# Patient Record
Sex: Male | Born: 1975 | Race: White | Hispanic: No | Marital: Married | Smoking: Former smoker
Health system: Southern US, Community
[De-identification: ages and names within clinical notes are randomized; demographics above are authoritative.]

## PROBLEM LIST (undated history)

## (undated) DIAGNOSIS — K219 Gastro-esophageal reflux disease without esophagitis: Secondary | ICD-10-CM

## (undated) DIAGNOSIS — M549 Dorsalgia, unspecified: Secondary | ICD-10-CM

## (undated) DIAGNOSIS — F32A Depression, unspecified: Secondary | ICD-10-CM

## (undated) DIAGNOSIS — R519 Headache, unspecified: Secondary | ICD-10-CM

## (undated) DIAGNOSIS — M1990 Unspecified osteoarthritis, unspecified site: Secondary | ICD-10-CM

## (undated) DIAGNOSIS — F419 Anxiety disorder, unspecified: Secondary | ICD-10-CM

## (undated) DIAGNOSIS — I10 Essential (primary) hypertension: Secondary | ICD-10-CM

## (undated) DIAGNOSIS — Z8719 Personal history of other diseases of the digestive system: Secondary | ICD-10-CM

## (undated) DIAGNOSIS — G4730 Sleep apnea, unspecified: Secondary | ICD-10-CM

## (undated) DIAGNOSIS — S069X9A Unspecified intracranial injury with loss of consciousness of unspecified duration, initial encounter: Secondary | ICD-10-CM

## (undated) DIAGNOSIS — R079 Chest pain, unspecified: Secondary | ICD-10-CM

## (undated) DIAGNOSIS — R51 Headache: Secondary | ICD-10-CM

## (undated) DIAGNOSIS — J189 Pneumonia, unspecified organism: Secondary | ICD-10-CM

## (undated) DIAGNOSIS — F4310 Post-traumatic stress disorder, unspecified: Secondary | ICD-10-CM

## (undated) DIAGNOSIS — F329 Major depressive disorder, single episode, unspecified: Secondary | ICD-10-CM

## (undated) HISTORY — DX: Essential (primary) hypertension: I10

## (undated) HISTORY — PX: VASECTOMY: SHX75

## (undated) HISTORY — PX: COLONOSCOPY W/ BIOPSIES AND POLYPECTOMY: SHX1376

## (undated) HISTORY — PX: MOUTH SURGERY: SHX715

## (undated) HISTORY — DX: Major depressive disorder, single episode, unspecified: F32.9

## (undated) HISTORY — DX: Dorsalgia, unspecified: M54.9

## (undated) HISTORY — DX: Gastro-esophageal reflux disease without esophagitis: K21.9

## (undated) HISTORY — DX: Unspecified osteoarthritis, unspecified site: M19.90

## (undated) HISTORY — DX: Sleep apnea, unspecified: G47.30

## (undated) HISTORY — DX: Anxiety disorder, unspecified: F41.9

## (undated) HISTORY — PX: TONSILLECTOMY: SUR1361

## (undated) HISTORY — PX: ESOPHAGOGASTRODUODENOSCOPY ENDOSCOPY: SHX5814

## (undated) HISTORY — DX: Depression, unspecified: F32.A

## (undated) HISTORY — DX: Chest pain, unspecified: R07.9

## (undated) HISTORY — PX: KNEE SURGERY: SHX244

---

## 2016-10-20 ENCOUNTER — Other Ambulatory Visit (HOSPITAL_COMMUNITY): Payer: Self-pay | Admitting: General Surgery

## 2016-10-20 ENCOUNTER — Telehealth: Payer: Self-pay

## 2016-10-20 NOTE — Telephone Encounter (Signed)
Received referral from  Referral from [facility] for Baratric surgery Placed notes in pink file.

## 2016-10-24 NOTE — Telephone Encounter (Signed)
Pt is going to see Dr [place]ains [place]

## 2016-11-06 ENCOUNTER — Other Ambulatory Visit: Payer: Self-pay

## 2016-11-06 ENCOUNTER — Ambulatory Visit (HOSPITAL_COMMUNITY)
Admission: RE | Admit: 2016-11-06 | Discharge: 2016-11-06 | Disposition: A | Discharge status: Home / Self Care | Payer: Federal, State, Local not specified - PPO | Source: Ambulatory Visit | Attending: General Surgery | Admitting: General Surgery

## 2016-11-06 DIAGNOSIS — K449 Diaphragmatic hernia without obstruction or gangrene: Secondary | ICD-10-CM | POA: Diagnosis not present

## 2016-11-06 DIAGNOSIS — Z01810 Encounter for preprocedural cardiovascular examination: Secondary | ICD-10-CM | POA: Diagnosis present

## 2016-11-08 ENCOUNTER — Encounter: Payer: Self-pay | Admitting: Cardiology

## 2016-11-20 ENCOUNTER — Encounter: Payer: Self-pay | Admitting: *Deleted

## 2016-11-20 ENCOUNTER — Ambulatory Visit: Payer: Self-pay | Admitting: Cardiology

## 2016-11-20 ENCOUNTER — Encounter: Payer: Self-pay | Admitting: Cardiology

## 2016-11-20 ENCOUNTER — Ambulatory Visit (INDEPENDENT_AMBULATORY_CARE_PROVIDER_SITE_OTHER): Payer: Federal, State, Local not specified - PPO | Admitting: Cardiology

## 2016-11-20 VITALS — BP 136/74 | HR 81 | Ht 69.0 in | Wt 293.2 lb

## 2016-11-20 DIAGNOSIS — Z01810 Encounter for preprocedural cardiovascular examination: Secondary | ICD-10-CM

## 2016-11-20 DIAGNOSIS — Z87891 Personal history of nicotine dependence: Secondary | ICD-10-CM

## 2016-11-20 DIAGNOSIS — G4733 Obstructive sleep apnea (adult) (pediatric): Secondary | ICD-10-CM | POA: Diagnosis not present

## 2016-11-20 DIAGNOSIS — F4310 Post-traumatic stress disorder, unspecified: Secondary | ICD-10-CM

## 2016-11-20 NOTE — Progress Notes (Addendum)
Cardiology Office Note    Date:  11/20/2016   ID:  Ronald Summers, DOB [birth year], MRN [id number]  PCP:  No PCP Per Patient  Cardiologist:   Donato Schultz, MD     History of Present Illness:  Ronald Summers is a 41 y.o. male here for preoperative risk stratification at the request of Dr. Gaynelle Summers prior to bariatric surgery.  He was in the [employer] for 14 years, developed PTSD, was placed on citalopram by [facility] and gained 100 pounds. He has been seeing Dr. Andrey Summers and discussed the possibility of weight loss sleave.  He has a family history of coronary artery disease, father MI at age 37, prior bypass, his mother had CHF.   No anginal symptoms, no chest pain he does have mild dyspnea with heavy exertion but this is not new for him. He quit smoking a proximal 77 months ago. He quit dipping as well. He does Vape.  He is easily able to go up 2 flights of stairs without difficulty, able to complete greater than 4 METS of activity.  Now has OSA. Knee pain.      Past Medical History:  Diagnosis Date  . Anxiety disorder   . Arthritis   . Back pain   . Chest pain   . Depression   . Gastroesophageal reflux disease   . High blood pressure   . Sleep apnea     Past Surgical History:  Procedure Laterality Date  . COLONOSCOPY W/ BIOPSIES AND POLYPECTOMY    . KNEE SURGERY Right   . MOUTH SURGERY    . TONSILLECTOMY    . VASECTOMY      Current Medications: Outpatient Medications Prior to Visit  Medication Sig Dispense Refill  . clonazePAM (KLONOPIN) 1 MG tablet Take 1 mg by mouth 2 (two) times daily.    Marland Kitchen FLUoxetine (PROZAC) 10 MG capsule Take 10 mg by mouth daily.     No facility-administered medications prior to visit.      Allergies:   Patient has no known allergies.   Social History   Social History  . Marital status: Single    Spouse name: N/A  . Number of children: N/A  . Years of education: N/A   Social History Main Topics  . Smoking status:  Former Games developer  . Smokeless tobacco: Never Used  . Alcohol use Yes  . Drug use: No  . Sexual activity: Not Asked   Other Topics Concern  . None   Social History Narrative  . None     Family History:  The patient's family history includes Arthritis in his father and mother; Diabetes Mellitus I in his brother and mother; Heart disease in his father and mother; Hypertension in his brother, father, and mother.   ROS:   Please see the history of present illness.    ROS All other systems reviewed and are negative.   PHYSICAL EXAM:   VS:  BP 136/74   Pulse 81   Ht  (1.753 m)   Wt 293 lb 4 oz (133 kg)   SpO2 98%   BMI 43.31 kg/m    GEN: Well nourished, well developed, in no acute distress  HEENT: normal  Neck: no JVD, carotid bruits, or masses Cardiac: RRR; no murmurs, rubs, or gallops,no edema  Respiratory:  clear to auscultation bilaterally, normal work of breathing GI: soft, nontender, nondistended, + BS, obese MS: no deformity or atrophy  Skin: warm and dry, no rash Neuro:  Alert and Oriented x 3, Strength and sensation are intact Psych: euthymic mood, full affect  Wt Readings from Last 3 Encounters:  11/20/16 293 lb 4 oz (133 kg)      Studies/Labs Reviewed:   EKG:  EKG from 3/26 [place] 18 shows sinus rhythm with no other abnormalities. Personally viewed.  Recent Labs: No results found for requested labs within last 8760 hours.   Lipid Panel No results found for: CHOL, TRIG, HDL, CHOLHDL, VLDL, LDLCALC, LDLDIRECT  Additional studies/ records that were reviewed today include:   Prior medical records reviewed, EKG reviewed, barium swallow with small hiatal hernia reviewed.    ASSESSMENT:    1. Pre-operative cardiovascular examination   2. Morbid obesity (HCC)   3. History of tobacco use   4. PTSD (post-traumatic stress disorder)   5. Obstructive sleep apnea      PLAN:  In order of problems listed above:  Preoperative risk assessment  - Based upon  his ability to achieve greater than 4 METS of activity, he may proceed with area check surgery with a low cardiac risk. EKG is normal. He has no valvular abnormalities, no arrhythmias, no prior MI or heart failure. He does have a family history of CAD with his father. I'm very happy that he has stopped smoking. Weight loss will be very helpful for him to modify his overall cardiovascular risk. He has had his cholesterol checked does not remember the value. Continue with diet, exercise, weight loss, tobacco cessation.  History of tobacco use  - Excellent job of tobacco cessation. Encouraged him to discontinue Vaping.   PTSD/depression  - Per [facility].  Morbid obesity  - Continue to encourage weight loss. Gastric sleeve will be very helpful.  Obstructive sleep apnea  - Hopefully weight loss will be helpful and redeeming this issue.  Medication Adjustments/Labs and Tests Ordered: Current medicines are reviewed at length with the patient today.  Concerns regarding medicines are outlined above.  Medication changes, Labs and Tests ordered today are listed in the Patient Instructions below. Patient Instructions  Medication Instructions:  The current medical regimen is effective;  continue present plan and medications.  Follow-Up: Follow up as needed with Dr Anne Fu.  Thank you for choosing [facility]!!        Signed, Donato Schultz, MD  11/20/2016 4:59 PM    [facility] [address] [place], [place], [place]  [address] Phone: [phone]; Fax: [phone]

## 2016-11-20 NOTE — Patient Instructions (Signed)
Medication Instructions:  The current medical regimen is effective;  continue present plan and medications.  Follow-Up: Follow up as needed with Dr Skains.  Thank you for choosing [facility]!!     

## 2016-12-04 ENCOUNTER — Encounter: Payer: Federal, State, Local not specified - PPO | Attending: General Surgery | Admitting: Skilled Nursing Facility1

## 2016-12-04 ENCOUNTER — Encounter: Payer: Self-pay | Admitting: Skilled Nursing Facility1

## 2016-12-04 DIAGNOSIS — Z713 Dietary counseling and surveillance: Secondary | ICD-10-CM | POA: Insufficient documentation

## 2016-12-04 DIAGNOSIS — Z6841 Body Mass Index (BMI) 40.0 and over, adult: Secondary | ICD-10-CM | POA: Diagnosis not present

## 2016-12-04 DIAGNOSIS — E669 Obesity, unspecified: Secondary | ICD-10-CM

## 2016-12-04 NOTE — Patient Instructions (Signed)
Follow Pre-Op Goals Try Protein Shakes Call [facility] at [phone] when surgery is scheduled to enroll in Pre-Op Class  Things to remember:  Please always be honest with us. We want to support you!  If you have any questions or concerns in between appointments, please call or email   The diet after surgery will be high protein and low in carbohydrate.  Vitamins and calcium need to be taken for the rest of your life.  Feel free to include support people in any classes or appointments.   Supplement recommendations:  Before Surgery   1 Complete Multivitamin with Iron  3000 IU Vitamin D3  After Surgery   2 Chewable Multivitamins  **Best Choice - Opurity: Bypass and Sleeve Optimized Chewable    Bariatric Advantage Advanced Multi EA      3 Chewable Calcium (500 mg each, total 1200-1500 mg per day)  **Best Choice - Celebrate, Bariatric Advantage, or Wellesse  Other Options:  2 Celebrate MultiComplete with 18 mg Iron (this provides 6000 IU of  Vitamin D3)  4 Celebrate Essential Multi 2 in 1 (has calcium) + 18-60 mg separate  iron  Vitamins and Calcium are available at:   [facility]   [address], [place], [place] [address]   [email or web address]  [email or web address]  [email or web address]    

## 2016-12-04 NOTE — Progress Notes (Signed)
Pre-Op Assessment Visit:  Pre-Operative Sleeve Gastrectomy Surgery  Medical Nutrition Therapy:  Appt start time: 3:10  End time:    Patient was seen on 12/04/2016 for Pre-Operative Nutrition Assessment. Assessment and letter of approval faxed to [facility] Bariatric Surgery Program coordinator on 12/04/2016.  Pt states he has been eating with his non-dominate hand and chewing well and using smaller plates. A BMI conversation should be had to ensure insurance coverage.   Pt expectation of surgery: "to lose 100 pounds" "no more knee problems and stop wearing my sleep pap"   Pt expectation of Dietitian: "To help with healthy choices"  Start weight at [facility]: 284.9 lbs BMI: 41.47  24 hr Dietary Recall: First Meal: none Snack:  Second Meal: chicken and vegetable---yogurt Snack:  Third Meal: spagetti---steak---hamburgers Snack: cookies---ice cream Beverages: water, coffee, soda  Encouraged to engage in 150 minutes of moderate physical activity including cardiovascular and weight baring weekly  Handouts given during visit include:  . Pre-Op Goals . Bariatric Surgery Protein Shakes During the appointment today the following Pre-Op Goals were reviewed with the patient: . Maintain or lose weight as instructed by your surgeon . Make healthy food choices . Begin to limit portion sizes . Limited concentrated sugars and fried foods . Keep fat/sugar in the single digits per serving on          food labels . Practice CHEWING your food  (aim for 30 chews per bite or until applesauce consistency) . Practice not drinking 15 minutes before, during, and 30 minutes after each meal/snack . Avoid all carbonated beverages  . Avoid/limit caffeinated beverages  . Avoid all sugar-sweetened beverages . Consume 3 meals per day; eat every 3-5 hours . Make a list of non-food related activities . Aim for 64-100 ounces of FLUID daily  . Aim for at least 60-80 grams of PROTEIN daily . Look for  a liquid protein source that contain ?15 g protein and ?5 g carbohydrate  (ex: shakes, drinks, shots)  -Follow diet recommendations listed below   Energy and Macronutrient Recomendations: Calories: 1800 Carbohydrate: 200 Protein: 135 Fat: 50  Demonstrated degree of understanding via:  Teach Back  Teaching Method Utilized:  Visual Auditory Hands on  Barriers to learning/adherence to lifestyle change: none identified   Patient to call the [facility] to enroll in Pre-Op and Post-Op Nutrition Education when surgery date is scheduled.

## 2017-01-01 ENCOUNTER — Encounter: Payer: Federal, State, Local not specified - PPO | Attending: General Surgery | Admitting: Registered"

## 2017-01-01 DIAGNOSIS — Z6841 Body Mass Index (BMI) 40.0 and over, adult: Secondary | ICD-10-CM | POA: Diagnosis not present

## 2017-01-01 DIAGNOSIS — E669 Obesity, unspecified: Secondary | ICD-10-CM

## 2017-01-01 DIAGNOSIS — Z713 Dietary counseling and surveillance: Secondary | ICD-10-CM | POA: Diagnosis present

## 2017-01-01 NOTE — Patient Instructions (Addendum)
-   Aim to not drink 15 min prior to meal, during, and waiting 30 minutes after meal.   - Add in some physical activity to daily routine.

## 2017-01-01 NOTE — Progress Notes (Signed)
Appt start time: 2:29 end time: 2:53  Assessment: 1st SWL Appointment.   Start Wt at [facility]: 284.9 Wt: 278.0 BMI: 40.46   Pt arrives having lost 6.9 lbs from previous visit. Pt states he eats differently now. Pt states he has cut out soda, coffee, and tea. Pt states he was recently promoted at job. 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 Pt is excited and ready to have surgery.   Pt states he has 2 more SWL visits with us prior to meals.    MEDICATIONS: See list   DIETARY INTAKE:  24-hr recall:  B ( AM): protein powder with almond or soy milk  Snk ( AM): cheese stick  L ( PM): chicken, vegetable Snk ( PM): cheese stick w/ apple or greek yogurt D ( PM): steak Snk ( PM): none Beverages: water with flavor  Usual physical activity: none  Diet to Follow: Calories: 1800 Carbohydrate: 200 Protein: 135 Fat: 50  Preferred Learning Style:   Auditory  Visual  Hands on  Learning Readiness:   Ready  Change in progress    Nutritional Diagnosis:  [place]-3.3 Overweight/obesity related to past poor dietary habits and physical inactivity as evidenced by patient w/ planned sleeve gastrectomy surgery following dietary guidelines for continued weight loss.    Intervention:  Nutrition counseling for upcoming Bariatric Surgery.  Goals:  - Aim to not drink 15 min prior to meal, during, and waiting 30 minutes after meal.  - Add in some physical activity to daily routine.   Teaching Method Utilized:  Visual Auditory Hands on  Handouts given during visit include:  Vitamin and Mineral Recommendations  Barriers to learning/adherence to lifestyle change: none  Demonstrated degree of understanding via:  Teach Back   Monitoring/Evaluation:  Dietary intake, exercise, and body weight in 1 month(s).

## 2017-01-29 ENCOUNTER — Encounter: Payer: Federal, State, Local not specified - PPO | Attending: General Surgery | Admitting: Registered"

## 2017-01-29 DIAGNOSIS — Z6841 Body Mass Index (BMI) 40.0 and over, adult: Secondary | ICD-10-CM | POA: Insufficient documentation

## 2017-01-29 DIAGNOSIS — Z713 Dietary counseling and surveillance: Secondary | ICD-10-CM | POA: Diagnosis present

## 2017-01-29 DIAGNOSIS — E669 Obesity, unspecified: Secondary | ICD-10-CM

## 2017-01-29 NOTE — Progress Notes (Signed)
Appt start time: 2:00 end time: 2:20  Assessment: 2nd SWL Appointment.   Start Wt at [facility]: 284.9 Wt: 278.0, 292.3 BMI: 40.46   Pt arrives having gained 14.3 lbs from previous visit. Pt states his dad had a stroke a few weeks ago, and stayed with him  While he was in the hospital for a week and a half. Pt states hospital food was his only option and there were limited healthy options. Pt states he used Isopure protein drink and it gives him diarrhea. Pt reports he will continue to use isopure powder and make the shake himself. Pt states he was recently promoted at his job and wants to have surgery in Oct. Pt states he was unable to workout and focus on not drinking with meals while dad was in hospital.   Pt states he has 1 more SWL visits with us prior to meals.    MEDICATIONS: See list   DIETARY INTAKE:  24-hr recall:  B ( AM): protein powder with almond/soy milk or protein drink Snk ( AM): cheese stick, beef jerky L ( PM): chicken, yogurt Snk ( PM): cheese stick w/ apple or greek yogurt D ( PM): steak, chicken, vegetables Snk ( PM): none Beverages: water with flavor, protein shake  Usual physical activity: none  Diet to Follow: Calories: 1800 Carbohydrate: 200 Protein: 135 Fat: 50  Preferred Learning Style:   Auditory  Visual  Hands on  Learning Readiness:   Ready  Change in progress    Nutritional Diagnosis:  [place]-3.3 Overweight/obesity related to past poor dietary habits and physical inactivity as evidenced by patient w/ planned sleeve gastrectomy surgery following dietary guidelines for continued weight loss.    Intervention:  Nutrition counseling for upcoming Bariatric Surgery.  Goals:  - Aim to not drink 15 min prior to meal, during, and waiting 30 minutes after meal.  - Add in some physical activity to daily routine.  - Aim to chew 30 times per bite or to applesauce consistency.   Teaching Method Utilized:  Visual Auditory Hands on  Handouts given  during visit include:  none  Barriers to learning/adherence to lifestyle change: none  Demonstrated degree of understanding via:  Teach Back   Monitoring/Evaluation:  Dietary intake, exercise, and body weight in 1 month(s).

## 2017-01-29 NOTE — Patient Instructions (Addendum)
-   Aim to not drink 15 min prior to meal, during, and waiting 30 minutes after meal.   - Add in some physical activity to daily routine.   - Aim to chew 30 times per bite or to applesauce consistency.

## 2017-02-26 ENCOUNTER — Encounter: Payer: Self-pay | Admitting: Skilled Nursing Facility1

## 2017-02-26 ENCOUNTER — Encounter: Payer: Federal, State, Local not specified - PPO | Attending: General Surgery | Admitting: Skilled Nursing Facility1

## 2017-02-26 DIAGNOSIS — Z713 Dietary counseling and surveillance: Secondary | ICD-10-CM | POA: Insufficient documentation

## 2017-02-26 DIAGNOSIS — Z6841 Body Mass Index (BMI) 40.0 and over, adult: Secondary | ICD-10-CM | POA: Insufficient documentation

## 2017-02-26 DIAGNOSIS — E669 Obesity, unspecified: Secondary | ICD-10-CM

## 2017-02-26 NOTE — Progress Notes (Signed)
Appt start time: 2:00 end time: 2:20  Assessment: 3rd SWL Appointment.   Start Wt at [facility]: 284.9 Wt: 276 BMI: 40.76  Pt arrives having lost about 16 pounds.   Pt states he has been Lifting or walking for stress. Pt states he is very ready for surgery and very excited.    MEDICATIONS: See list   DIETARY INTAKE:  24-hr recall:  B ( AM): protein powder with almond/soy milk or protein drink Snk ( AM): cheese stick, beef jerky L ( PM): chicken, yogurt Snk ( PM): cheese stick w/ apple or greek yogurt D ( PM): steak, chicken, vegetables Snk ( PM): none Beverages: water with flavor, protein shake  Usual physical activity: weight lifting, walking every other night  Diet to Follow: Calories: 1800 Carbohydrate: 200 Protein: 135 Fat: 50  Preferred Learning Style:   Auditory  Visual  Hands on  Learning Readiness:   Ready  Change in progress    Nutritional Diagnosis:  [place]-3.3 Overweight/obesity related to past poor dietary habits and physical inactivity as evidenced by patient w/ planned sleeve gastrectomy surgery following dietary guidelines for continued weight loss.    Intervention:  Nutrition counseling for upcoming Bariatric Surgery.  Goals:  - Aim to not drink 15 min prior to meal, during, and waiting 30 minutes after meal.  - Add in some physical activity to daily routine.  - Aim to chew 30 times per bite or to applesauce consistency.   Teaching Method Utilized:  Visual Auditory Hands on  Handouts given during visit include:  none  Barriers to learning/adherence to lifestyle change: none  Demonstrated degree of understanding via:  Teach Back   Monitoring/Evaluation:  Dietary intake, exercise, and body weight in 1 month(s).

## 2017-03-19 ENCOUNTER — Encounter: Payer: Federal, State, Local not specified - PPO | Attending: General Surgery | Admitting: Skilled Nursing Facility1

## 2017-03-19 ENCOUNTER — Encounter: Payer: Self-pay | Admitting: Skilled Nursing Facility1

## 2017-03-19 DIAGNOSIS — Z713 Dietary counseling and surveillance: Secondary | ICD-10-CM | POA: Diagnosis not present

## 2017-03-19 DIAGNOSIS — E669 Obesity, unspecified: Secondary | ICD-10-CM

## 2017-03-19 DIAGNOSIS — Z6841 Body Mass Index (BMI) 40.0 and over, adult: Secondary | ICD-10-CM | POA: Diagnosis not present

## 2017-03-19 NOTE — Progress Notes (Signed)
Pre-Operative Nutrition Class:  Appt start time: 7408   End time:  1830.  Patient was seen on 03/19/2017 for Pre-Operative Bariatric Surgery [facility]   Surgery date:  Surgery type: Sleeve Start weight at [facility]: 284.9 Weight today: 277  Samples given per MNT protocol. Patient educated on appropriate usage: Celebrate Vitamins Multivitamin Lot # [id number] Exp: 01/2018  Celebrate Vitamins Calcium  Lot # [id number] Exp:dec-03-2017  [place]rmier Protein Shake Lot #[id number] Exp: 24/jan/2019 The following the learning objectives were met by the patient during this course:  Identify Pre-Op Dietary Goals and will begin 2 weeks pre-operatively  Identify appropriate sources of fluids and proteins   State protein recommendations and appropriate sources pre and post-operatively  Identify Post-Operative Dietary Goals and will follow for 2 weeks post-operatively  Identify appropriate multivitamin and calcium sources  Describe the need for physical activity post-operatively and will follow MD recommendations  State when to call healthcare provider regarding medication questions or post-operative complications  Handouts given during class include:  Pre-Op Bariatric Surgery Diet Handout  Protein Shake Handout  Post-Op Bariatric Surgery Nutrition Handout  [facility] Program Information Flyer  Support Group Information Flyer  [facility] Bariatric Supplements Price List  Follow-Up Plan: Patient will follow-up at [facility] 2 weeks post operatively for diet advancement per MD.

## 2017-04-04 NOTE — Progress Notes (Signed)
Clearance 11/20/16   DR. Skains on chart and epic

## 2017-04-04 NOTE — Progress Notes (Signed)
Please place orders in EPIC as patient is being scheduled for a pre-op appointment! Thank you! 

## 2017-04-04 NOTE — Patient Instructions (Addendum)
Ronald Summers  04/04/2017   Your procedure is scheduled on: 04/09/17  Report to [facility] Main  Entrance Take [place]  elevators to 3rd floor to  Short Stay Center at     1020AM.    Call this number if you have problems the morning of surgery [phone]    Remember: ONLY 1 PERSON MAY GO WITH YOU TO SHORT STAY TO GET  READY MORNING OF YOUR SURGERY.  Do not eat food or drink liquids :After Midnight.     Take these medicines the morning of surgery with A SIP OF WATER:  prozac , klonopin,                                You may not have any metal on your body including hair pins and              piercings  Do not wear jewelry,  lotions, powders or perfumes, deodorant                     Men may shave face and neck.   Do not bring valuables to the hospital. [facility] IS NOT             RESPONSIBLE   FOR VALUABLES.  Contacts, dentures or bridgework may not be worn into surgery.  Leave suitcase in the car. After surgery it may be brought to your room.                  Please read over the following fact sheets you were given: _____________________________________________________________________           [facility] - Preparing for Surgery Before surgery, you can play an important role.  Because skin is not sterile, your skin needs to be as free of germs as possible.  You can reduce the number of germs on your skin by washing with CHG (chlorahexidine gluconate) soap before surgery.  CHG is an antiseptic cleaner which kills germs and bonds with the skin to continue killing germs even after washing. Please DO NOT use if you have an allergy to CHG or antibacterial soaps.  If your skin becomes reddened/irritated stop using the CHG and inform your nurse when you arrive at Short Stay. Do not shave (including legs and underarms) for at least 48 hours prior to the first CHG shower.  You may shave your face/neck. Please follow these instructions carefully:  1.   Shower with CHG Soap the night before surgery and the  morning of Surgery.  2.  If you choose to wash your hair, wash your hair first as usual with your  normal  shampoo.  3.  After you shampoo, rinse your hair and body thoroughly to remove the  shampoo.                           4.  Use CHG as you would any other liquid soap.  You can apply chg directly  to the skin and wash                       Gently with a scrungie or clean washcloth.  5.  Apply the CHG Soap to your body ONLY FROM THE NECK DOWN.   Do not use  on face/ open                           Wound or open sores. Avoid contact with eyes, ears mouth and genitals (private parts).                       Wash face,  Genitals (private parts) with your normal soap.             6.  Wash thoroughly, paying special attention to the area where your surgery  will be performed.  7.  Thoroughly rinse your body with warm water from the neck down.  8.  DO NOT shower/wash with your normal soap after using and rinsing off  the CHG Soap.                9.  Pat yourself dry with a clean towel.            10.  Wear clean pajamas.            11.  Place clean sheets on your bed the night of your first shower and do not  sleep with pets. Day of Surgery : Do not apply any lotions/deodorants the morning of surgery.  Please wear clean clothes to the hospital/surgery center.  FAILURE TO FOLLOW THESE INSTRUCTIONS MAY RESULT IN THE CANCELLATION OF YOUR SURGERY PATIENT SIGNATURE_________________________________  NURSE SIGNATURE__________________________________  ________________________________________________________________________   Ronald Summers  An incentive spirometer is a tool that can help keep your lungs clear and active. This tool measures how well you are filling your lungs with each breath. Taking long deep breaths may help reverse or decrease the chance of developing breathing (pulmonary) problems (especially infection) following:  A long  period of time when you are unable to move or be active. BEFORE THE PROCEDURE   If the spirometer includes an indicator to show your best effort, your nurse or respiratory therapist will set it to a desired goal.  If possible, sit up straight or lean slightly forward. Try not to slouch.  Hold the incentive spirometer in an upright position. INSTRUCTIONS FOR USE  1. Sit on the edge of your bed if possible, or sit up as far as you can in bed or on a chair. 2. Hold the incentive spirometer in an upright position. 3. Breathe out normally. 4. Place the mouthpiece in your mouth and seal your lips tightly around it. 5. Breathe in slowly and as deeply as possible, raising the piston or the ball toward the top of the column. 6. Hold your breath for 3-5 seconds or for as long as possible. Allow the piston or ball to fall to the bottom of the column. 7. Remove the mouthpiece from your mouth and breathe out normally. 8. Rest for a few seconds and repeat Steps 1 through 7 at least 10 times every 1-2 hours when you are awake. Take your time and take a few normal breaths between deep breaths. 9. The spirometer may include an indicator to show your best effort. Use the indicator as a goal to work toward during each repetition. 10. After each set of 10 deep breaths, practice coughing to be sure your lungs are clear. If you have an incision (the cut made at the time of surgery), support your incision when coughing by placing a pillow or rolled up towels firmly against it. Once you are able to get out of bed, walk around  indoors and cough well. You may stop using the incentive spirometer when instructed by your caregiver.  RISKS AND COMPLICATIONS  Take your time so you do not get dizzy or light-headed.  If you are in pain, you may need to take or ask for pain medication before doing incentive spirometry. It is harder to take a deep breath if you are having pain. AFTER USE  Rest and breathe slowly and  easily.  It can be helpful to keep track of a log of your progress. Your caregiver can provide you with a simple table to help with this. If you are using the spirometer at home, follow these instructions: [place] IF:   You are having difficultly using the spirometer.  You have trouble using the spirometer as often as instructed.  Your pain medication is not giving enough relief while using the spirometer.  You develop fever of 100.5 F (38.1 C) or higher. SEEK IMMEDIATE MEDICAL CARE IF:   You cough up bloody sputum that had not been present before.  You develop fever of 102 F (38.9 C) or greater.  You develop worsening pain at or near the incision site. MAKE SURE YOU:   Understand these instructions.  Will watch your condition.  Will get help right away if you are not doing well or get worse. Document Released: 12/11/2006 Document Revised: 10/23/2011 Document Reviewed: 02/11/2007 [facility] Patient Information 2014 [place], [place].   ________________________________________________________________________

## 2017-04-04 NOTE — Progress Notes (Addendum)
ekg 11/06/16 epic cxr 11/06/16 epic

## 2017-04-05 ENCOUNTER — Ambulatory Visit: Payer: Self-pay | Admitting: General Surgery

## 2017-04-06 ENCOUNTER — Encounter (HOSPITAL_COMMUNITY): Payer: Self-pay

## 2017-04-06 ENCOUNTER — Encounter (HOSPITAL_COMMUNITY)
Admission: RE | Admit: 2017-04-06 | Discharge: 2017-04-06 | Disposition: A | Discharge status: Home / Self Care | Payer: Federal, State, Local not specified - PPO | Source: Ambulatory Visit | Attending: General Surgery | Admitting: General Surgery

## 2017-04-06 ENCOUNTER — Ambulatory Visit: Payer: Self-pay | Admitting: General Surgery

## 2017-04-06 HISTORY — DX: Personal history of other diseases of the digestive system: Z87.19

## 2017-04-06 HISTORY — DX: Unspecified intracranial injury with loss of consciousness of unspecified duration, initial encounter: S06.9X9A

## 2017-04-06 HISTORY — DX: Headache: R51

## 2017-04-06 HISTORY — DX: Headache, unspecified: R51.9

## 2017-04-06 HISTORY — DX: Pneumonia, unspecified organism: J18.9

## 2017-04-06 HISTORY — DX: Post-traumatic stress disorder, unspecified: F43.10

## 2017-04-06 HISTORY — DX: Anxiety disorder, unspecified: F41.9

## 2017-04-06 LAB — COMPREHENSIVE METABOLIC PANEL
ALBUMIN: 4.3 g/dL (ref 3.5–5.0)
ALT: 31 U/L (ref 17–63)
ANION GAP: 8 (ref 5–15)
AST: 27 U/L (ref 15–41)
Alkaline Phosphatase: 72 U/L (ref 38–126)
BUN: 9 mg/dL (ref 6–20)
CHLORIDE: 103 mmol/L (ref 101–111)
CO2: 27 mmol/L (ref 22–32)
Calcium: 9.2 mg/dL (ref 8.9–10.3)
Creatinine, Ser: 1.06 mg/dL (ref 0.61–1.24)
GFR calc Af Amer: >60 mL/min (ref >60)
GFR calc non Af Amer: >60 mL/min (ref >60)
GLUCOSE: 101 mg/dL — AB (ref 65–99)
POTASSIUM: 4.2 mmol/L (ref 3.5–5.1)
SODIUM: 138 mmol/L (ref 135–145)
TOTAL PROTEIN: 7.9 g/dL (ref 6.5–8.1)
Total Bilirubin: 1 mg/dL (ref 0.3–1.2)

## 2017-04-06 LAB — CBC WITH DIFFERENTIAL/PLATELET
BASOS ABS: 0 10*3/uL (ref 0.0–0.1)
BASOS PCT: 0 %
Eosinophils Absolute: 0.2 10*3/uL (ref 0.0–0.7)
Eosinophils Relative: 2 %
HEMATOCRIT: 46.2 % (ref 39.0–52.0)
HEMOGLOBIN: 16.4 g/dL (ref 13.0–17.0)
LYMPHS PCT: 26 %
Lymphs Abs: 2 10*3/uL (ref 0.7–4.0)
MCH: 30.3 pg (ref 26.0–34.0)
MCHC: 35.5 g/dL (ref 30.0–36.0)
MCV: 85.2 fL (ref 78.0–100.0)
MONO ABS: 0.5 10*3/uL (ref 0.1–1.0)
Monocytes Relative: 7 %
NEUTROS ABS: 5.2 10*3/uL (ref 1.7–7.7)
NEUTROS PCT: 65 %
PLATELETS: 235 10*3/uL (ref 150–400)
RBC: 5.42 MIL/uL (ref 4.22–5.81)
RDW: 12.3 % (ref 11.5–15.5)
WBC: 7.9 10*3/uL (ref 4.0–10.5)

## 2017-04-06 NOTE — H&P (Signed)
Ronald Summers 04/06/2017 8:15 AM Location: [facility] Patient #: [id number] DOB: 08/09/1976 Married / Language: English / Race: White Male  History of Present Illness ([name] Geovanie Winnett MD; 04/06/2017 8:38 AM) The patient is a 41 year old male who presents for a pre-op visit. He comes in today for his preoperative visit. I initially met him in March of this year. His weight at that time was 281 pounds. He denies any medical changes since he was initially seen. He still occasionally has reflux symptoms. He is still compliant with his CPAP. He did see Dr. Skains in cardiology and was found to be low risk for surgery. His bariatric evaluation lab work was unremarkable. His preoperative upper GI did show a small hiatal hernia. He denies any chest pain, chest pressure, source of breath, orthopnea, paroxysmal nocturnal dyspnea, melena or hematochezia. He denies any hematemesis.   Problem List/Past Medical ([name] 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 Conlan [name], MD; 04/06/2017 8:41 AM) Colon Polyp Removal - Colonoscopy Knee Surgery Right. Oral Surgery Tonsillectomy Vasectomy  Diagnostic Studies History ([name] M. [name], MD; 04/06/2017 8:41 AM) Colonoscopy 1-5 years ago  Allergies (Crystale Giannattasio M. Meya Clutter, MD; 04/06/2017 8:41 AM) No Known Drug Allergies 10/19/2016  Medication History ([name] [name], MD; 04/06/2017 8:41 AM) Medications Reconciled OxyCODONE HCl (5MG/5ML Solution, 5-10 Milliliter Oral every four hours, as needed, Taken starting  04/06/2017) Active. Zofran ODT (4MG Tablet Disint, 1 (one) Tablet Disperse Oral every six hours, as needed, Taken starting 04/06/2017) Active. Protonix (40MG Tablet DR, 1 (one) Tablet Oral daily, Taken starting 04/06/2017) Active. PROzac (10MG Capsule, Oral) Active. KlonoPIN (1MG Tablet, Oral) Active.  Social History ([name] [name], MD; 04/06/2017 8:41 AM) Alcohol use Occasional alcohol use. Caffeine use Carbonated beverages, Coffee, Tea. No drug use Tobacco use Former smoker.  Family History ([name] [name], MD; 04/06/2017 8:41 AM) Arthritis Father, Mother. Diabetes Mellitus Brother, Mother. Heart Disease Father, Mother. Heart disease in male family member before age 55 Hypertension Brother, Father, Mother.  Other Problems ([name] [name], MD; 04/06/2017 8:42 AM) Anxiety Disorder Arthritis Back Pain Chest pain Depression Gastroesophageal Reflux Disease High blood pressure Sleep Apnea     Review of Systems ([name] [name]; 04/06/2017 8:38 AM) General Present- Fatigue and Weight Gain. Not Present- Appetite Loss, Chills, Fever, Night Sweats and Weight Loss. Skin Present- Dryness and Non-Healing Wounds. Not Present- Change in Wart/Mole, Hives, Jaundice, New Lesions, Rash and Ulcer. HEENT Present- Hearing Loss, Ringing in the Ears and Wears glasses/contact lenses. Not Present- Earache, Hoarseness, Nose Bleed, Oral Ulcers, Seasonal Allergies, Sinus Pain, Sore Throat, Visual Disturbances and Yellow Eyes. Respiratory Present- Snoring and Wheezing. Not Present- Bloody sputum, Chronic Cough and Difficulty Breathing. Breast Not Present- Breast Mass, Breast Pain, Nipple Discharge and Skin Changes. Cardiovascular Present- Chest Pain, Difficulty Breathing Lying Down, Leg Cramps, Palpitations and Shortness of Breath. Not Present- Rapid Heart Rate and Swelling of Extremities. Gastrointestinal Present- Change in Bowel Habits and Difficulty Swallowing. Not Present-  Abdominal Pain, Bloating, Bloody Stool, Chronic diarrhea, Constipation, Excessive gas, Gets full quickly at meals, Hemorrhoids, Indigestion, Nausea, Rectal Pain and Vomiting. Male Genitourinary Present- Nocturia. Not   Present- Blood in Urine, Change in Urinary Stream, Frequency, Impotence, Painful Urination, Urgency and Urine Leakage. Musculoskeletal Present- Back Pain, Joint Pain, Joint Stiffness and Muscle Pain. Not Present- Muscle Weakness and Swelling of Extremities. Neurological Present- Decreased Memory and Headaches. Not Present- Fainting, Numbness, Seizures, Tingling, Tremor, Trouble walking and Weakness. Psychiatric Present- Anxiety and Change in Sleep Pattern. Not Present- Bipolar, Depression, Fearful and Frequent crying. Endocrine Present- Excessive Hunger. Not Present- Cold Intolerance, Hair Changes, Heat Intolerance and New Diabetes.  Vitals ([name]; 04/06/2017 8:16 AM) 04/06/2017 8:15 AM Weight: 266 lb Height: 70in Body Surface Area: 2.36 m Body Mass Index: 38.17 kg/m  Temp.: 98.7F(Oral)  Pulse: 72 (Regular)  BP: 132/80 (Sitting, Left Arm, Standard)      Assessment & Plan ([name] [name]; 04/06/2017 8:42 AM)  OBESITY, CLASS III, BMI 40-49.9 (MORBID OBESITY) (E66.01) Impression: We reviewed his preoperative workup. We discussed the lab results. We also reviewed his cardiac consultation. All of his questions were asked and answered regarding the intraoperative and postoperative recovery. We also discussed the postoperative diet plan. We also discussed changes in eating techniques as well. He was given his preoperative instructions today along with his postoperative medications.  Current Plans Pt Education - EMW_preopbariatric GASTROESOPHAGEAL REFLUX DISEASE, ESOPHAGITIS PRESENCE NOT SPECIFIED (K21.9) Story: had egd at [facility]; reports he had dilation  OBSTRUCTIVE SLEEP APNEA ON CPAP (G47.33)  ANXIETY AND DEPRESSION (F41.9)  HIATAL HERNIA  (K44.9) Impression: We discussed the findings of a hiatal hernia on upper GI. I explained that I would test for one intraoperatively. We discussed what operative repair would involve. We systemically discuss dissecting out the left and right crura of the diaphragm and reapproximating it was sutured. I believe this may be achieving to sensation of intermittent food getting stuck particularly thick solids. We did briefly discuss that he may also need dilatation as well however I believe his hiatal hernia is more of a problem.  CHRONIC PAIN IN RIGHT SHOULDER (M25.511)  CHRONIC PAIN OF LEFT KNEE (M25.562)  Amillya Chavira M. Joan Avetisyan, MD, FACS General, Bariatric, & Minimally Invasive Surgery [facility]   

## 2017-04-09 ENCOUNTER — Encounter (HOSPITAL_COMMUNITY): Payer: Self-pay | Admitting: *Deleted

## 2017-04-09 ENCOUNTER — Inpatient Hospital Stay (HOSPITAL_COMMUNITY): Payer: Federal, State, Local not specified - PPO | Admitting: Certified Registered Nurse Anesthetist

## 2017-04-09 ENCOUNTER — Encounter (HOSPITAL_COMMUNITY)
Admission: RE | Disposition: A | Discharge status: Home / Self Care | Payer: Self-pay | Source: Home / Self Care | Attending: General Surgery

## 2017-04-09 ENCOUNTER — Inpatient Hospital Stay (HOSPITAL_COMMUNITY)
Admission: RE | Admit: 2017-04-09 | Discharge: 2017-04-10 | DRG: 621 | Disposition: A | Discharge status: Home / Self Care | Payer: Federal, State, Local not specified - PPO | Attending: General Surgery | Admitting: General Surgery

## 2017-04-09 DIAGNOSIS — Z6839 Body mass index (BMI) 39.0-39.9, adult: Secondary | ICD-10-CM

## 2017-04-09 DIAGNOSIS — M25562 Pain in left knee: Secondary | ICD-10-CM | POA: Diagnosis present

## 2017-04-09 DIAGNOSIS — K219 Gastro-esophageal reflux disease without esophagitis: Secondary | ICD-10-CM | POA: Diagnosis present

## 2017-04-09 DIAGNOSIS — M549 Dorsalgia, unspecified: Secondary | ICD-10-CM | POA: Diagnosis present

## 2017-04-09 DIAGNOSIS — Z79899 Other long term (current) drug therapy: Secondary | ICD-10-CM | POA: Diagnosis not present

## 2017-04-09 DIAGNOSIS — G4733 Obstructive sleep apnea (adult) (pediatric): Secondary | ICD-10-CM

## 2017-04-09 DIAGNOSIS — F329 Major depressive disorder, single episode, unspecified: Secondary | ICD-10-CM | POA: Diagnosis present

## 2017-04-09 DIAGNOSIS — Z9884 Bariatric surgery status: Secondary | ICD-10-CM

## 2017-04-09 DIAGNOSIS — G8929 Other chronic pain: Secondary | ICD-10-CM | POA: Diagnosis present

## 2017-04-09 DIAGNOSIS — F419 Anxiety disorder, unspecified: Secondary | ICD-10-CM | POA: Diagnosis present

## 2017-04-09 DIAGNOSIS — Z9989 Dependence on other enabling machines and devices: Secondary | ICD-10-CM

## 2017-04-09 DIAGNOSIS — M25511 Pain in right shoulder: Secondary | ICD-10-CM | POA: Diagnosis present

## 2017-04-09 HISTORY — PX: LAPAROSCOPIC GASTRIC SLEEVE RESECTION: SHX5895

## 2017-04-09 HISTORY — PX: UPPER GI ENDOSCOPY: SHX6162

## 2017-04-09 LAB — HEMOGLOBIN AND HEMATOCRIT, BLOOD
HEMATOCRIT: 46.7 % (ref 39.0–52.0)
HEMOGLOBIN: 16.2 g/dL (ref 13.0–17.0)

## 2017-04-09 SURGERY — GASTRECTOMY, SLEEVE, LAPAROSCOPIC
Anesthesia: General | Site: Abdomen

## 2017-04-09 MED ORDER — SUGAMMADEX SODIUM 500 MG/5ML IV SOLN
INTRAVENOUS | Status: AC
  Filled 2017-04-09: qty 5

## 2017-04-09 MED ORDER — DIPHENHYDRAMINE HCL 50 MG/ML IJ SOLN: 12.5000 mg | Freq: Three times a day (TID) | INTRAMUSCULAR | Status: DC | PRN

## 2017-04-09 MED ORDER — BUPIVACAINE LIPOSOME 1.3 % IJ SUSP
20.0000 mL | Freq: Once | INTRAMUSCULAR | Status: AC
  Administered 2017-04-09: 20 mL
  Filled 2017-04-09: qty 20

## 2017-04-09 MED ORDER — GABAPENTIN 300 MG PO CAPS
300.0000 mg | ORAL_CAPSULE | ORAL | Status: AC
  Administered 2017-04-09: 300 mg via ORAL
  Filled 2017-04-09: qty 1

## 2017-04-09 MED ORDER — FENTANYL CITRATE (PF) 100 MCG/2ML IJ SOLN
INTRAMUSCULAR | Status: DC | PRN
  Administered 2017-04-09 (×6): 50 ug via INTRAVENOUS

## 2017-04-09 MED ORDER — FENTANYL CITRATE (PF) 100 MCG/2ML IJ SOLN
INTRAMUSCULAR | Status: AC
  Filled 2017-04-09: qty 2

## 2017-04-09 MED ORDER — LIDOCAINE 2% (20 MG/ML) 5 ML SYRINGE
INTRAMUSCULAR | Status: DC | PRN
  Administered 2017-04-09: 100 mg via INTRAVENOUS

## 2017-04-09 MED ORDER — PREMIER PROTEIN SHAKE: 2.0000 [oz_av] | ORAL | Status: DC

## 2017-04-09 MED ORDER — SODIUM CHLORIDE 0.9 % IJ SOLN
INTRAMUSCULAR | Status: AC
  Filled 2017-04-09: qty 50

## 2017-04-09 MED ORDER — LACTATED RINGERS IV SOLN
INTRAVENOUS | Status: DC
  Administered 2017-04-09: 11:00:00 via INTRAVENOUS

## 2017-04-09 MED ORDER — PHENYLEPHRINE 40 MCG/ML (10ML) SYRINGE FOR IV PUSH (FOR BLOOD PRESSURE SUPPORT)
PREFILLED_SYRINGE | INTRAVENOUS | Status: AC
  Filled 2017-04-09: qty 10

## 2017-04-09 MED ORDER — MORPHINE SULFATE (PF) 2 MG/ML IV SOLN
1.0000 mg | INTRAVENOUS | Status: DC | PRN
  Administered 2017-04-09 (×2): 2 mg via INTRAVENOUS
  Filled 2017-04-09 (×2): qty 1

## 2017-04-09 MED ORDER — ACETAMINOPHEN 160 MG/5ML PO SOLN
325.0000 mg | ORAL | Status: DC | PRN
Start: 2017-04-09 — End: 2017-04-10

## 2017-04-09 MED ORDER — PANTOPRAZOLE SODIUM 40 MG IV SOLR
40.0000 mg | Freq: Every day | INTRAVENOUS | Status: DC
  Administered 2017-04-09: 40 mg via INTRAVENOUS
  Filled 2017-04-09: qty 40

## 2017-04-09 MED ORDER — PROMETHAZINE HCL 25 MG/ML IJ SOLN
6.2500 mg | INTRAMUSCULAR | Status: AC | PRN
  Administered 2017-04-09 (×2): 6.25 mg via INTRAVENOUS

## 2017-04-09 MED ORDER — ONDANSETRON HCL 4 MG/2ML IJ SOLN
4.0000 mg | Freq: Four times a day (QID) | INTRAMUSCULAR | Status: DC | PRN
  Administered 2017-04-09: 4 mg via INTRAVENOUS
  Filled 2017-04-09: qty 2

## 2017-04-09 MED ORDER — SODIUM CHLORIDE 0.9 % IJ SOLN
INTRAMUSCULAR | Status: DC | PRN
  Administered 2017-04-09: 50 mL via INTRAVENOUS

## 2017-04-09 MED ORDER — OXYCODONE HCL 5 MG/5ML PO SOLN
5.0000 mg | ORAL | Status: DC | PRN
  Administered 2017-04-10: 5 mg via ORAL
  Filled 2017-04-09: qty 5

## 2017-04-09 MED ORDER — ONDANSETRON HCL 4 MG/2ML IJ SOLN
INTRAMUSCULAR | Status: DC | PRN
  Administered 2017-04-09: 4 mg via INTRAVENOUS

## 2017-04-09 MED ORDER — SUCCINYLCHOLINE CHLORIDE 200 MG/10ML IV SOSY
PREFILLED_SYRINGE | INTRAVENOUS | Status: DC | PRN
  Administered 2017-04-09: 140 mg via INTRAVENOUS

## 2017-04-09 MED ORDER — SUGAMMADEX SODIUM 200 MG/2ML IV SOLN
INTRAVENOUS | Status: AC
  Filled 2017-04-09: qty 2

## 2017-04-09 MED ORDER — BUPIVACAINE LIPOSOME 1.3 % IJ SUSP
INTRAMUSCULAR | Status: DC | PRN
  Administered 2017-04-09: 20 mL

## 2017-04-09 MED ORDER — FENTANYL CITRATE (PF) 250 MCG/5ML IJ SOLN
INTRAMUSCULAR | Status: AC
  Filled 2017-04-09: qty 5

## 2017-04-09 MED ORDER — ENOXAPARIN SODIUM 40 MG/0.4ML ~~LOC~~ SOLN
40.0000 mg | SUBCUTANEOUS | Status: DC
  Administered 2017-04-09: 40 mg via SUBCUTANEOUS
  Filled 2017-04-09: qty 0.4

## 2017-04-09 MED ORDER — ACETAMINOPHEN 500 MG PO TABS
1000.0000 mg | ORAL_TABLET | ORAL | Status: AC
  Administered 2017-04-09: 1000 mg via ORAL
  Filled 2017-04-09: qty 2

## 2017-04-09 MED ORDER — ACETAMINOPHEN 325 MG PO TABS: 650.0000 mg | ORAL_TABLET | ORAL | Status: DC | PRN

## 2017-04-09 MED ORDER — DEXAMETHASONE SODIUM PHOSPHATE 10 MG/ML IJ SOLN
INTRAMUSCULAR | Status: DC | PRN
  Administered 2017-04-09: 10 mg via INTRAVENOUS

## 2017-04-09 MED ORDER — HYDRALAZINE HCL 20 MG/ML IJ SOLN
INTRAMUSCULAR | Status: AC
  Administered 2017-04-09: 5 mg
  Filled 2017-04-09: qty 1

## 2017-04-09 MED ORDER — PHENYLEPHRINE 40 MCG/ML (10ML) SYRINGE FOR IV PUSH (FOR BLOOD PRESSURE SUPPORT)
PREFILLED_SYRINGE | INTRAVENOUS | Status: DC | PRN
  Administered 2017-04-09: 80 ug via INTRAVENOUS

## 2017-04-09 MED ORDER — CHLORHEXIDINE GLUCONATE 4 % EX LIQD: 60.0000 mL | Freq: Once | CUTANEOUS | Status: DC

## 2017-04-09 MED ORDER — HEPARIN SODIUM (PORCINE) 5000 UNIT/ML IJ SOLN
5000.0000 [IU] | INTRAMUSCULAR | Status: AC
  Administered 2017-04-09: 5000 [IU] via SUBCUTANEOUS
  Filled 2017-04-09: qty 1

## 2017-04-09 MED ORDER — HYDROMORPHONE HCL-NACL 0.5-0.9 MG/ML-% IV SOSY
0.2500 mg | PREFILLED_SYRINGE | INTRAVENOUS | Status: DC | PRN
  Administered 2017-04-09 (×3): 0.5 mg via INTRAVENOUS

## 2017-04-09 MED ORDER — SCOPOLAMINE 1 MG/3DAYS TD PT72
1.0000 | MEDICATED_PATCH | TRANSDERMAL | Status: DC
  Administered 2017-04-09: 1.5 mg via TRANSDERMAL
  Filled 2017-04-09: qty 1

## 2017-04-09 MED ORDER — ONDANSETRON HCL 4 MG/2ML IJ SOLN
INTRAMUSCULAR | Status: AC
  Filled 2017-04-09: qty 2

## 2017-04-09 MED ORDER — HYDROMORPHONE HCL-NACL 0.5-0.9 MG/ML-% IV SOSY
PREFILLED_SYRINGE | INTRAVENOUS | Status: AC
  Filled 2017-04-09: qty 4

## 2017-04-09 MED ORDER — SIMETHICONE 80 MG PO CHEW: 80.0000 mg | CHEWABLE_TABLET | Freq: Four times a day (QID) | ORAL | Status: DC | PRN

## 2017-04-09 MED ORDER — LORAZEPAM 2 MG/ML IJ SOLN: 1.0000 mg | Freq: Three times a day (TID) | INTRAMUSCULAR | Status: DC | PRN

## 2017-04-09 MED ORDER — SUGAMMADEX SODIUM 200 MG/2ML IV SOLN
INTRAVENOUS | Status: DC | PRN
  Administered 2017-04-09: 242.2 mg via INTRAVENOUS

## 2017-04-09 MED ORDER — PROPOFOL 10 MG/ML IV BOLUS
INTRAVENOUS | Status: DC | PRN
  Administered 2017-04-09: 250 mg via INTRAVENOUS

## 2017-04-09 MED ORDER — HYDRALAZINE HCL 20 MG/ML IJ SOLN: 5.0000 mg | Freq: Once | INTRAMUSCULAR | Status: DC

## 2017-04-09 MED ORDER — MIDAZOLAM HCL 5 MG/5ML IJ SOLN
INTRAMUSCULAR | Status: DC | PRN
  Administered 2017-04-09: 2 mg via INTRAVENOUS

## 2017-04-09 MED ORDER — PROMETHAZINE HCL 25 MG/ML IJ SOLN
INTRAMUSCULAR | Status: AC
  Filled 2017-04-09: qty 1

## 2017-04-09 MED ORDER — KCL IN DEXTROSE-NACL 20-5-0.45 MEQ/L-%-% IV SOLN
INTRAVENOUS | Status: DC
  Administered 2017-04-09: 19:00:00 via INTRAVENOUS
  Administered 2017-04-10: 1000 mL via INTRAVENOUS
  Filled 2017-04-09 (×3): qty 1000

## 2017-04-09 MED ORDER — CEFOTETAN DISODIUM-DEXTROSE 2-2.08 GM-% IV SOLR
2.0000 g | INTRAVENOUS | Status: AC
  Administered 2017-04-09: 2 g via INTRAVENOUS
  Filled 2017-04-09: qty 50

## 2017-04-09 MED ORDER — PROPOFOL 10 MG/ML IV BOLUS
INTRAVENOUS | Status: AC
  Filled 2017-04-09: qty 20

## 2017-04-09 MED ORDER — HYDRALAZINE HCL 20 MG/ML IJ SOLN
10.0000 mg | INTRAMUSCULAR | Status: DC | PRN
  Filled 2017-04-09: qty 0.5

## 2017-04-09 MED ORDER — MIDAZOLAM HCL 2 MG/2ML IJ SOLN
INTRAMUSCULAR | Status: AC
  Filled 2017-04-09: qty 2

## 2017-04-09 MED ORDER — DEXAMETHASONE SODIUM PHOSPHATE 10 MG/ML IJ SOLN
INTRAMUSCULAR | Status: AC
  Filled 2017-04-09: qty 1

## 2017-04-09 MED ORDER — LACTATED RINGERS IR SOLN
Status: DC | PRN
Start: 2017-04-09 — End: 2017-04-09
  Administered 2017-04-09: 1000 mL

## 2017-04-09 MED ORDER — APREPITANT 40 MG PO CAPS
40.0000 mg | ORAL_CAPSULE | ORAL | Status: AC
  Administered 2017-04-09: 40 mg via ORAL
  Filled 2017-04-09: qty 1

## 2017-04-09 MED ORDER — ROCURONIUM BROMIDE 10 MG/ML (PF) SYRINGE
PREFILLED_SYRINGE | INTRAVENOUS | Status: DC | PRN
  Administered 2017-04-09: 50 mg via INTRAVENOUS
  Administered 2017-04-09: 20 mg via INTRAVENOUS

## 2017-04-09 MED ORDER — PROMETHAZINE HCL 25 MG/ML IJ SOLN: 12.5000 mg | Freq: Four times a day (QID) | INTRAMUSCULAR | Status: DC | PRN

## 2017-04-09 MED ORDER — DEXAMETHASONE SODIUM PHOSPHATE 4 MG/ML IJ SOLN: 4.0000 mg | INTRAMUSCULAR | Status: DC

## 2017-04-09 MED ORDER — ROCURONIUM BROMIDE 50 MG/5ML IV SOSY
PREFILLED_SYRINGE | INTRAVENOUS | Status: AC
  Filled 2017-04-09: qty 5

## 2017-04-09 SURGICAL SUPPLY — 77 items
ADH SKN CLS APL DERMABOND .7 (GAUZE/BANDAGES/DRESSINGS)
APL SKNCLS STERI-STRIP NONHPOA (GAUZE/BANDAGES/DRESSINGS) ×2
APL SRG 32X5 SNPLK LF DISP (MISCELLANEOUS)
APPLICATOR COTTON TIP 6IN STRL (MISCELLANEOUS) IMPLANT
APPLIER CLIP ROT 10 11.4 M/L (STAPLE)
APPLIER CLIP ROT 13.4 12 LRG (CLIP)
APR CLP LRG 13.4X12 ROT 20 MLT (CLIP)
APR CLP MED LRG 11.4X10 (STAPLE)
BANDAGE ADH SHEER 1  50/CT (GAUZE/BANDAGES/DRESSINGS) ×12 IMPLANT
BENZOIN TINCTURE PRP APPL 2/3 (GAUZE/BANDAGES/DRESSINGS) ×2 IMPLANT
BLADE SURG SZ11 CARB STEEL (BLADE) ×4 IMPLANT
CABLE HIGH FREQUENCY MONO STRZ (ELECTRODE) ×2 IMPLANT
CHLORAPREP W/TINT 26ML (MISCELLANEOUS) ×8 IMPLANT
CLIP APPLIE ROT 10 11.4 M/L (STAPLE) IMPLANT
CLIP APPLIE ROT 13.4 12 LRG (CLIP) IMPLANT
CLOSURE WOUND 1/2 X4 (GAUZE/BANDAGES/DRESSINGS) ×1
DECANTER SPIKE VIAL GLASS SM (MISCELLANEOUS) ×4 IMPLANT
DERMABOND ADVANCED (GAUZE/BANDAGES/DRESSINGS)
DERMABOND ADVANCED .7 DNX12 (GAUZE/BANDAGES/DRESSINGS) IMPLANT
DEVICE SUT QUICK LOAD TK 5 (STAPLE) IMPLANT
DEVICE SUT TI-KNOT TK 5X26 (MISCELLANEOUS) IMPLANT
DEVICE SUTURE ENDOST 10MM (ENDOMECHANICALS) IMPLANT
DEVICE TI KNOT TK5 (MISCELLANEOUS)
DRAPE UTILITY XL STRL (DRAPES) ×8 IMPLANT
ELECT L-HOOK LAP 45CM DISP (ELECTROSURGICAL)
ELECT PENCIL ROCKER SW 15FT (MISCELLANEOUS) IMPLANT
ELECT REM PT RETURN 15FT ADLT (MISCELLANEOUS) ×4 IMPLANT
ELECTRODE L-HOOK LAP 45CM DISP (ELECTROSURGICAL) IMPLANT
GAUZE SPONGE 2X2 8PLY STRL LF (GAUZE/BANDAGES/DRESSINGS) IMPLANT
GAUZE SPONGE 4X4 12PLY STRL (GAUZE/BANDAGES/DRESSINGS) IMPLANT
GLOVE BIO SURGEON STRL SZ7.5 (GLOVE) ×4 IMPLANT
GLOVE INDICATOR 8.0 STRL GRN (GLOVE) ×4 IMPLANT
GOWN STRL REUS W/TWL XL LVL3 (GOWN DISPOSABLE) ×15 IMPLANT
GRASPER SUT TROCAR 14GX15 (MISCELLANEOUS) IMPLANT
HOVERMATT SINGLE USE (MISCELLANEOUS) ×4 IMPLANT
KIT BASIN OR (CUSTOM PROCEDURE TRAY) ×4 IMPLANT
MARKER SKIN DUAL TIP RULER LAB (MISCELLANEOUS) ×4 IMPLANT
NDL SPNL 22GX3.5 QUINCKE BK (NEEDLE) ×2 IMPLANT
NEEDLE SPNL 22GX3.5 QUINCKE BK (NEEDLE) ×4 IMPLANT
PACK UNIVERSAL I (CUSTOM PROCEDURE TRAY) ×4 IMPLANT
QUICK LOAD TK 5 (STAPLE)
RELOAD STAPLE 60 3.6 BLU REG (STAPLE) ×2 IMPLANT
RELOAD STAPLE 60 3.8 GOLD REG (STAPLE) ×2 IMPLANT
RELOAD STAPLE 60 4.1 GRN THCK (STAPLE) ×2 IMPLANT
RELOAD STAPLER BLUE 60MM (STAPLE) ×8 IMPLANT
RELOAD STAPLER GOLD 60MM (STAPLE) IMPLANT
RELOAD STAPLER GREEN 60MM (STAPLE) ×4 IMPLANT
SCISSORS LAP 5X45 EPIX DISP (ENDOMECHANICALS) ×4 IMPLANT
SEALANT SURGICAL APPL DUAL CAN (MISCELLANEOUS) IMPLANT
SET IRRIG TUBING LAPAROSCOPIC (IRRIGATION / IRRIGATOR) ×4 IMPLANT
SHEARS HARMONIC ACE PLUS 45CM (MISCELLANEOUS) ×4 IMPLANT
SLEEVE GASTRECTOMY 40FR VISIGI (MISCELLANEOUS) ×4 IMPLANT
SLEEVE XCEL OPT CAN 5 100 (ENDOMECHANICALS) ×12 IMPLANT
SOLUTION ANTI FOG 6CC (MISCELLANEOUS) ×4 IMPLANT
SPONGE GAUZE 2X2 STER 10/PKG (GAUZE/BANDAGES/DRESSINGS)
SPONGE LAP 18X18 X RAY DECT (DISPOSABLE) ×4 IMPLANT
STAPLER ECHELON BIOABSB 60 FLE (MISCELLANEOUS) ×14 IMPLANT
STAPLER ECHELON LONG 60 440 (INSTRUMENTS) ×2 IMPLANT
STAPLER RELOAD BLUE 60MM (STAPLE) ×16
STAPLER RELOAD GOLD 60MM (STAPLE)
STAPLER RELOAD GREEN 60MM (STAPLE) ×8
STRIP CLOSURE SKIN 1/2X4 (GAUZE/BANDAGES/DRESSINGS) ×2 IMPLANT
SUT MNCRL AB 4-0 PS2 18 (SUTURE) ×4 IMPLANT
SUT SURGIDAC NAB ES-9 0 48 120 (SUTURE) IMPLANT
SUT VICRYL 0 TIES 12 18 (SUTURE) ×4 IMPLANT
SYR 10ML ECCENTRIC (SYRINGE) ×4 IMPLANT
SYR 20CC LL (SYRINGE) ×4 IMPLANT
SYR 50ML LL SCALE MARK (SYRINGE) ×4 IMPLANT
TOWEL OR 17X26 10 PK STRL BLUE (TOWEL DISPOSABLE) ×4 IMPLANT
TOWEL OR NON WOVEN STRL DISP B (DISPOSABLE) ×4 IMPLANT
TRAY FOLEY W/METER SILVER 16FR (SET/KITS/TRAYS/PACK) IMPLANT
TROCAR BLADELESS 15MM (ENDOMECHANICALS) ×4 IMPLANT
TROCAR BLADELESS OPT 5 100 (ENDOMECHANICALS) ×4 IMPLANT
TUBING CONNECTING 10 (TUBING) ×4 IMPLANT
TUBING CONNECTING 10' (TUBING) ×2
TUBING ENDO SMARTCAP (MISCELLANEOUS) ×4 IMPLANT
TUBING INSUF HEATED (TUBING) ×4 IMPLANT

## 2017-04-09 NOTE — Progress Notes (Signed)
Patient just admitted to department.  Alert and oriented.  Family at bedside.  Patient with complaints of pain/nausea.  Bedside RN alerted to patient needs. No questions identified at this time.

## 2017-04-09 NOTE — Op Note (Signed)
04/09/2017 Ronald Summers 07-Aug-1976 [id number]   PRE-OPERATIVE DIAGNOSIS:     Morbid obesity BMI 40   OSA on CPAP   Chronic pain of left knee   Chronic pain in right shoulder  POST-OPERATIVE DIAGNOSIS:  same  PROCEDURE:  Procedure(s): LAPAROSCOPIC SLEEVE GASTRECTOMY  UPPER GI ENDOSCOPY  SURGEON:  Surgeon(s): Atilano Ina, MD FACS FASMBS  ASSISTANTS: Phylliss Blakes MD  ANESTHESIA:   general  DRAINS: none   BOUGIE: 40 fr ViSiGi  LOCAL MEDICATIONS USED:  MARCAINE + Exparel  SPECIMEN:  Source of Specimen:  Greater curvature of stomach  DISPOSITION OF SPECIMEN:  PATHOLOGY  COUNTS:  YES  INDICATION FOR PROCEDURE: This is a very pleasant 41 y.o.-year-old morbidly obese male who has had unsuccessful attempts for sustained weight loss. The patient presents today for a planned laparoscopic sleeve gastrectomy with upper endoscopy & possible hiatal hernia repair. We have discussed the risk and benefits of the procedure extensively preoperatively. Please see my separate notes. His preop UGI showed a small hiatal hernia. However he as no reflux. Occasionally he will have sensation of solids getting stuck. There was no stricture or delay of contrast on UGI.   PROCEDURE: After obtaining informed consent and receiving 5000 units of subcutaneous heparin, the patient was brought to the operating room at [facility] and placed supine on the operating room table. General endotracheal anesthesia was established. Sequential compression devices were placed. A orogastric tube was placed. The patient's abdomen was prepped and draped in the usual standard surgical fashion. The patient received preoperative IV antibiotic. A surgical timeout was performed.  Access to the abdomen was achieved using a 5 mm 0 laparoscope thru a 5 mm trocar In the left upper Quadrant 2 fingerbreadths below the left subcostal margin using the Optiview technique. Pneumoperitoneum was smoothly established up to 15  mm of mercury. The laparoscope was advanced and the abdominal cavity was surveilled. The patient was then placed in reverse Trendelenburg. There was no evidence of a hiatal hernia on laparoscopy - gap in the left and right crus anteriorly.  A 5 mm trocar was placed slightly above and to the left of the umbilicus under direct visualization.  The [facility] liver retractor was placed under the left lobe of the liver through a 5 mm trocar incision site in the subxiphoid position. A 5 mm trocar was placed in the lateral right upper quadrant along with a 15 mm trocar in the mid right abdomen. A final 5 mm trocar was placed in the lateral LUQ.  All under direct visualization after local had been infiltrated.  The stomach was inspected. It was completely decompressed and the orogastric tube was removed.  There was no anterior dimple that was obviously visible. The calibration tube was placed in the oropharynx and guided down into the stomach by the CRNA. About an inch below the GE junction in the proximal stomach, the CRNA reported a little resistance but the tube then advanced further into stomach easily. There was no sign of extrinsic stricture.  10 mL of air was insufflated into the calibration balloon. The calibration tubing was then gently pulled back by the CRNA and it did not slid past the GE junction. At this point the calibration tubing was desufflated and pulled back into the esophagus. This confirmed my suspicion of no  clinically significant hiatal hernia. I decided to proceed with taking down short gastrics and then re-inspecting hiatus.   We identified the pylorus and measured 6 cm proximal to  the pylorus and identified an area of where we would start taking down the short gastric vessels. Harmonic scalpel was used to take down the short gastric vessels along the greater curvature of the stomach. We were able to enter the lesser sac. We continued to march along the greater curvature of the stomach taking  down the short gastrics. As we approached the gastrosplenic ligament we took care in this area not to injure the spleen. We were able to take down the entire gastrosplenic ligament. We then mobilized the fundus away from the left crus of diaphragm. There were not any significant posterior gastric avascular attachments. This left the stomach completely mobilized. No vessels had been taken down along the lesser curvature of the stomach.  I decided to inspect the hiatus closer. The gastrohepatic ligament was incised with harmonic scalpel. The right crus was identified. We identified the crossing fat along the right crus. The adipose tissue just above this area was incised with harmonic scalpel. I then bluntly dissected out this area and identified the left crus. There was no evidence of a hiatal hernia.  We then reidentified the pylorus. A 40Fr ViSiGi was then placed in the oropharynx and advanced down into the stomach and placed in the distal antrum and positioned along the lesser curvature. It was placed under suction which secured the 40Fr ViSiGi in place along the lesser curve. Then using the [employer] echelon 60 mm stapler with a green load with Seamguard, I placed a stapler along the antrum approximately 5 cm from the pylorus. The stapler was angled so that there is ample room at the angularis incisura. I then fired the first staple load after inspecting it posteriorly to ensure adequate space both anteriorly and posteriorly. At this point I still was not completely past the angularis so with another green load with Seamguard, I placed the stapler in position just inside the prior stapleline. We then rotated the stomach to insure that there was adequate anteriorly as well as posteriorly. The stapler was then fired.  At this point I started using 60 mm blue load staple cartridges with Seamguard. The echelon stapler was then repositioned with a 60 mm blue load with Seamguard and we continued to march up along the  ViSiGi. My assistant was holding traction along the greater curvature stomach along the cauterized short gastric vessels ensuring that the stomach was symmetrically retracted. Prior to each firing of the staple, we rotated the stomach to ensure that there is adequate stomach left.  As we approached the fundus, I used 60 mm blue cartridge with Seamguard aiming slightly lateral to the GE junction after I had mobilized the esophageal fat pad. Although the staples on this fire had completely gone thru the last part of the stomach it had not completely cut it. Therefore 1 additional 60 blue load was used to free the remaining stomach. The sleeve was inspected. There is no evidence of cork screw. The staple line appeared hemostatic. The CRNA inflated the ViSiGi to the green zone and the upper abdomen was flooded with saline. There were no bubbles. The sleeve was decompressed and the ViSiGi removed. My assistant scrubbed out and performed an upper endoscopy. The sleeve easily distended with air and the scope was easily advanced to the pylorus. There is no evidence of internal bleeding or cork screwing. There was no narrowing at the angularis. There is no evidence of bubbles. Please see his operative note for further details. The gastric sleeve was decompressed and the  endoscope was removed.  The greater curvature the stomach was grasped with a laparoscopic grasper and removed from the 15 mm trocar site.  The liver retractor was removed. I then closed the 15 mm trocar site with 1 interrupted 0 Vicryl sutures through the fascia using the endoclose. The closure was viewed laparoscopically and it was airtight. Remaining Exparel was then infiltrated in the preperitoneal spaces around the trocar sites. Pneumoperitoneum was released. All trocar sites were closed with a 4-0 Monocryl in a subcuticular fashion followed by the application of benzoin, steri-strips, and bandages. The patient was extubated and taken to the recovery room  in stable condition. All needle, instrument, and sponge counts were correct x2. There are no immediate complications  (2) 60 mm green with Seamguard  (4) 60 mm blue with 3 seamguard  PLAN OF CARE: Admit to inpatient   PATIENT DISPOSITION:  PACU - hemodynamically stable.   Delay start of Pharmacological VTE agent (>24hrs) due to surgical blood loss or risk of bleeding:  no  Mary Sella. Andrey Campanile, MD, FACS FASMBS General, Bariatric, & Minimally Invasive Surgery [facility], [place]

## 2017-04-09 NOTE — Transfer of Care (Signed)
Immediate Anesthesia Transfer of Care Note  Patient: Ronald Summers  Procedure(s) Performed: Procedure(s): LAPAROSCOPIC GASTRIC SLEEVE RESECTION (N/A) UPPER GI ENDOSCOPY  Patient Location: PACU  Anesthesia Type:General  Level of Consciousness: awake, alert , oriented and patient cooperative  Airway & Oxygen Therapy: Patient Spontanous Breathing and Patient connected to face mask oxygen  Post-op Assessment: Report given to RN, Post -op Vital signs reviewed and stable and Patient moving all extremities  Post vital signs: Reviewed and stable  Last Vitals:  Vitals:   04/09/17 1011 04/09/17 1044  BP: (!) 149/103 121/83  Pulse: 79   Resp: 18   Temp: 37.4 C   SpO2: 99%     Last Pain:  Vitals:   04/09/17 1011  TempSrc: Oral      Patients Stated Pain Goal: 4 (04/09/17 1034)  Complications: No apparent anesthesia complications

## 2017-04-09 NOTE — Interval H&P Note (Signed)
History and Physical Interval Note:  04/09/2017 11:52 AM  Ronald Summers  has presented today for surgery, with the diagnosis of MORBID OBESITY  The various methods of treatment have been discussed with the patient and family. After consideration of risks, benefits and other options for treatment, the patient has consented to  Procedure(s): LAPAROSCOPIC GASTRIC SLEEVE RESECTION (N/A) as a surgical intervention .  The patient's history has been reviewed, patient examined, no change in status, stable for surgery.  I have reviewed the patient's chart and labs.  Questions were answered to the patient's satisfaction.    With possible hiatal hernia repair  Mary Sella. Andrey Campanile, MD, FACS General, Bariatric, & Minimally Invasive Surgery [facility], [place]   [facility] M

## 2017-04-09 NOTE — Op Note (Signed)
Preoperative diagnosis: laparoscopic sleeve gastrectomy  Postoperative diagnosis: Same   Procedure: Upper endoscopy   Surgeon: Berna Bue, M.D.  Anesthesia: Gen.   Indications for procedure: This patient was undergoing a laparoscopic sleeve gastrectomy.   Description of procedure: The endoscopy was placed in the mouth and into the oropharynx and under endoscopic vision it was advanced to the esophagogastric junction. The pouch was insufflated and no bleeding or bubbles were seen. The GEJ was identified at 46cm from the teeth. The scope passed easily through the esophagus, GE junction and proximal stomach.  No bleeding or leaks were detected. The scope was withdrawn without difficulty.   Berna Bue, M.D. General, Bariatric, & Minimally Invasive Surgery [facility] [place]

## 2017-04-09 NOTE — Anesthesia Preprocedure Evaluation (Signed)
Anesthesia Evaluation  Patient identified by MRN, date of birth, ID band Patient awake    Reviewed: Allergy & Precautions, NPO status , Patient's Chart, lab work & pertinent test results  Airway Mallampati: II  TM Distance: >3 FB Neck ROM: Full    Dental no notable dental hx.    Pulmonary sleep apnea , former smoker,    breath sounds clear to auscultation       Cardiovascular hypertension,  Rhythm:Regular Rate:Normal     Neuro/Psych Anxiety Depression    GI/Hepatic hiatal hernia, GERD  ,  Endo/Other  Morbid obesity  Renal/GU      Musculoskeletal   Abdominal (+) + obese,   Peds  Hematology   Anesthesia Other Findings   Reproductive/Obstetrics                             Anesthesia Physical Anesthesia Plan  ASA: II  Anesthesia Plan: General   Post-op Pain Management:    Induction:   PONV Risk Score and Plan: 4 or greater and Ondansetron, Dexamethasone, Midazolam, Scopolamine patch - Pre-op and Treatment may vary due to age or medical condition  Airway Management Planned: Oral ETT  Additional Equipment:   Intra-op Plan:   Post-operative Plan: Extubation in OR  Informed Consent: I have reviewed the patients History and Physical, chart, labs and discussed the procedure including the risks, benefits and alternatives for the proposed anesthesia with the patient or authorized representative who has indicated his/her understanding and acceptance.   Dental advisory given  Plan Discussed with: CRNA  Anesthesia Plan Comments:         Anesthesia Quick Evaluation

## 2017-04-09 NOTE — Discharge Instructions (Signed)
° ° ° °GASTRIC BYPASS/SLEEVE ° Home Care Instructions ° ° These instructions are to help you care for yourself when you go home. ° °Call: If you have any problems. °• Call [phone] and ask for the surgeon on call °• If you need immediate assistance come to the ER at [facility]. Tell the ER staff you are a new post-op gastric bypass or gastric sleeve patient  °Signs and symptoms to report: • Severe  vomiting or nausea °o If you cannot handle clear liquids for longer than 1 day, call your surgeon °• Abdominal pain which does not get better after taking your pain medication °• Fever greater than 100.4°  F and chills °• Heart rate over 100 beats a minute °• Trouble breathing °• Chest pain °• Redness,  swelling, drainage, or foul odor at incision (surgical) sites °• If your incisions open or pull apart °• Swelling or pain in calf (lower leg) °• Diarrhea (Loose bowel movements that happen often), frequent watery, uncontrolled bowel movements °• Constipation, (no bowel movements for 3 days) if this happens: °o Take Milk of Magnesia, 2 tablespoons by mouth, 3 times a day for 2 days if needed °o Stop taking Milk of Magnesia once you have had a bowel movement °o Call your doctor if constipation continues °Or °o Take Miralax  (instead of Milk of Magnesia) following the label instructions °o Stop taking Miralax once you have had a bowel movement °o Call your doctor if constipation continues °• Anything you think is “abnormal for you” °  °Normal side effects after surgery: • Unable to sleep at night or unable to concentrate °• Irritability °• Being tearful (crying) or depressed ° °These are common complaints, possibly related to your anesthesia, stress of surgery, and change in lifestyle, that usually go away a few weeks after surgery. If these feelings continue, call your medical doctor.  °Wound Care: You may have surgical glue, steri-strips, or staples over your incisions after surgery °• Surgical glue: Looks like clear  film over your incisions and will wear off a little at a time °• Steri-strips: Adhesive strips of tape over your incisions. You may notice a yellowish color on skin under the steri-strips. This is used to make the steri-strips stick better. Do not pull the steri-strips off - let them fall off °• Staples: Staples may be removed before you leave the hospital °o If you go home with staples, call [facility] for an appointment with your surgeon’s nurse to have staples removed 10 days after surgery, [phone] °• Showering: You may shower two (2) days after your surgery unless your surgeon tells you differently °o Wash gently around incisions with warm soapy water, rinse well, and gently pat dry °o If you have a drain (tube from your incision), you may need someone to hold this while you shower °o No tub baths until staples are removed and incisions are healed °  °Medications: • Medications should be liquid or crushed if larger than the size of a dime °• Extended release pills (medication that releases a little bit at a time through the  day) should not be crushed °• Depending on the size and number of medications you take, you may need to space (take a few throughout the day)/change the time you take your medications so that you do not over-fill your pouch (smaller stomach) °• Make sure you follow-up with you primary care physician to make medication changes needed during rapid weight loss and life -style changes °•   If you have diabetes, follow up with your doctor that orders your diabetes medication(s) within one week after surgery and check your blood sugar regularly ° °• Do not drive while taking narcotics (pain medications) ° °• Do not take acetaminophen (Tylenol) and Roxicet or Lortab Elixir at the same time since these pain medications contain acetaminophen °  °Diet:  °First 2 Weeks You will see the nutritionist about two (2) weeks after your surgery. The nutritionist will increase the types of  foods you can eat if you are handling liquids well: °• If you have severe vomiting or nausea and cannot handle clear liquids lasting longer than 1 day call your surgeon °Protein Shake °• Drink at least 2 ounces of shake 5-6 times per day °• Each serving of protein shakes (usually 8-12 ounces) should have a minimum of: °o 15 grams of protein °o And no more than 5 grams of carbohydrate °• Goal for protein each day: °o Men = 80 grams per day °o Women = 60 grams per day °  ° • Protein powder may be added to fluids such as non-fat milk or Lactaid milk or Soy milk (limit to 35 grams added protein powder per serving) ° °Hydration °• Slowly increase the amount of water and other clear liquids as tolerated (See Acceptable Fluids) °• Slowly increase the amount of protein shake as tolerated °• Sip fluids slowly and throughout the day °• May use sugar substitutes in small amounts (no more than 6-8 packets per day; i.e. Splenda) ° °Fluid Goal °• The first goal is to drink at least 8 ounces of protein shake/drink per day (or as directed by the nutritionist); some examples of protein shakes are Syntrax Nectar, Adkins Advantage, EAS Edge HP, and Unjury. - See handout from pre-op Bariatric Education Class: °o Slowly increase the amount of protein shake you drink as tolerated °o You may find it easier to slowly sip shakes throughout the day °o It is important to get your proteins in first °• Your fluid goal is to drink 64-100 ounces of fluid daily °o It may take a few weeks to build up to this  °• 32 oz. (or more) should be clear liquids °And °• 32 oz. (or more) should be full liquids (see below for examples) °• Liquids should not contain sugar, caffeine, or carbonation ° °Clear Liquids: °• Water of Sugar-free flavored water (i.e. Fruit H²O, Propel) °• Decaffeinated coffee or tea (sugar-free) °• Crystal lite, Wyler’s Lite, Minute Maid Lite °• Sugar-free Jell-O °• Bouillon or broth °• Sugar-free Popsicle:    - Less than 20 calories  each; Limit 1 per day ° °Full Liquids: °                  Protein Shakes/Drinks + 2 choices per day of other full liquids °• Full liquids must be: °o No More Than 12 grams of Carbs per serving °o No More Than 3 grams of Fat per serving °• Strained low-fat cream soup °• Non-Fat milk °• Fat-free Lactaid Milk °• Sugar-free yogurt (Dannon Lite & Fit, Greek yogurt) ° °  °Vitamins and Minerals • Start 1 day after surgery unless otherwise directed by your surgeon °• 2 Chewable Multivitamin / Multimineral Supplement with iron  °• Chewable Calcium Citrate with Vitamin D-3 °(Example: 3 Chewable Calcium  Plus 600 with Vitamin D-3) °o Take 500 mg three (3) times a day for a total of 1500 mg each day °o Do not take all 3 doses of calcium   at one time as it may cause constipation, and you can only absorb 500 mg at a time °o Do not mix multivitamins containing iron with calcium supplements;  take 2 hours apart °• Menstruating women and those at risk for anemia ( a blood disease that causes weakness) may need extra iron °o Talk to your doctor to see if you need more iron °• If you need extra iron: Total daily Iron recommendation (including Vitamins) is 50 to 100 mg Iron/day °• Do not stop taking or change any vitamins or minerals until you talk to your nutritionist or surgeon °• Your nutritionist and/or surgeon must approve all vitamin and mineral supplements °  °Activity and Exercise: It is important to continue walking at home. Limit your physical activity as instructed by your doctor. During this time, use these guidelines: °• Do not lift anything greater than ten  (10) pounds for at least two (2) weeks °• Do not go back to work or drive until your surgeon says you can °• You may have sex when you feel comfortable °o It is VERY important for male patients to use a reliable birth control method; fertility often increase after surgery °o Do not get pregnant for at least 18 months °• Start exercising as soon as your doctor tells  you that you can °o Make sure your doctor approves any physical activity °• Start with a simple walking program °• Walk 5-15 minutes each day, 7 days per week °• Slowly increase until you are walking 30-45 minutes per day °• Consider joining our BELT program. [phone] or email [email or web address] °  °Special Instructions Things to remember: °• Use your CPAP when sleeping if this applies to you °• Consider buying a medical alert bracelet that says you had lap-band surgery °  °  You will likely have your first fill (fluid added to your band) 6 - 8 weeks after surgery °• [facility] has a free Bariatric Surgery Support Group that meets monthly, the 3rd Thursday, 6pm. [employer] Classrooms. You can see classes online at [email or web address] °• It is very important to keep all follow up appointments with your surgeon, nutritionist, primary care physician, and behavioral health practitioner °o After the first year, please follow up with your bariatric surgeon and nutritionist at least once a year in order to maintain best weight loss results °      °             [facility]:  [phone] ° °             [facility]: [phone] ° °             Bariatric Nurse Coordinator: [phone]  °Gastric Bypass/Sleeve Home Care Instructions  Rev. 09/2012    ° °                                                    Reviewed and Endorsed °                                                   by [place] Patient Education Committee, Jan, 2014 ° ° ° ° ° ° ° ° ° °

## 2017-04-09 NOTE — H&P (View-Only) (Signed)
Ronald Summers 04/06/2017 8:15 AM Location: [facility] Patient #: [id number] DOB: Jan 07, 1976 Married / Language: Ronald Summers / Race: White Male  History of Present Illness Ronald Hiss M. Alexiz Sustaita MD; 04/06/2017 8:38 AM) The patient is a 41 year old male who presents for a pre-op visit. He comes in today for his preoperative visit. I initially met him in March of this year. His weight at that time was 281 pounds. He denies any medical changes since he was initially seen. He still occasionally has reflux symptoms. He is still compliant with his CPAP. He did see Dr. Marlou Summers in cardiology and was found to be low risk for surgery. His bariatric evaluation lab work was unremarkable. His preoperative upper GI did show a small hiatal hernia. He denies any chest pain, chest pressure, source of breath, orthopnea, paroxysmal nocturnal dyspnea, melena or hematochezia. He denies any hematemesis.   Problem List/Past Medical Ronald Hiss M. 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 Redmond Pulling, MD; 04/06/2017 8:41 AM) Colon Polyp Removal - Colonoscopy Knee Surgery Right. Oral Surgery Tonsillectomy Vasectomy  Diagnostic Studies History Ronald Hiss M. Redmond Pulling, MD; 04/06/2017 8:41 AM) Colonoscopy 1-5 years ago  Allergies Ronald Hiss M. Redmond Pulling, MD; 04/06/2017 8:41 AM) No Known Drug Allergies 10/19/2016  Medication History Ronald Hiss M. Redmond Pulling, MD; 04/06/2017 8:41 AM) Medications Reconciled OxyCODONE HCl (5MG/5ML Solution, 5-10 Milliliter Oral every four hours, as needed, Taken starting  04/06/2017) Active. Zofran ODT (4MG Tablet Disint, 1 (one) Tablet Disperse Oral every six hours, as needed, Taken starting 04/06/2017) Active. Protonix (40MG Tablet DR, 1 (one) Tablet Oral daily, Taken starting 04/06/2017) Active. PROzac (10MG Capsule, Oral) Active. KlonoPIN (1MG Tablet, Oral) Active.  Social History Ronald Hiss M. Redmond Pulling, MD; 04/06/2017 8:41 AM) Alcohol use Occasional alcohol use. Caffeine use Carbonated beverages, Coffee, Tea. No drug use Tobacco use Former smoker.  Family History Ronald Hiss M. Redmond Pulling, MD; 04/06/2017 8:41 AM) Arthritis Ronald Summers, Ronald Summers. Diabetes Mellitus Ronald Summers, Ronald Summers. Heart Disease Ronald Summers, Ronald Summers. Heart disease in male family member before age 3 Hypertension Ronald Summers, Ronald Summers, Ronald Summers.  Other Problems Ronald Hiss M. Redmond Pulling, MD; 04/06/2017 8:42 AM) Anxiety Disorder Arthritis Back Pain Chest pain Depression Gastroesophageal Reflux Disease High blood pressure Sleep Apnea     Review of Systems Ronald Hiss M. Ronald Colter MD; 04/06/2017 8:38 AM) General Present- Fatigue and Weight Gain. Not Present- Appetite Loss, Chills, Fever, Night Sweats and Weight Loss. Skin Present- Dryness and Non-Healing Wounds. Not Present- Change in Wart/Mole, Hives, Jaundice, New Lesions, Rash and Ulcer. HEENT Present- Hearing Loss, Ringing in the Ears and Wears glasses/contact lenses. Not Present- Earache, Hoarseness, Nose Bleed, Oral Ulcers, Seasonal Allergies, Sinus Pain, Sore Throat, Visual Disturbances and Yellow Eyes. Respiratory Present- Snoring and Wheezing. Not Present- Bloody sputum, Chronic Cough and Difficulty Breathing. Breast Not Present- Breast Mass, Breast Pain, Nipple Discharge and Skin Changes. Cardiovascular Present- Chest Pain, Difficulty Breathing Lying Down, Leg Cramps, Palpitations and Shortness of Breath. Not Present- Rapid Heart Rate and Swelling of Extremities. Gastrointestinal Present- Change in Bowel Habits and Difficulty Swallowing. Not Present-  Abdominal Pain, Bloating, Bloody Stool, Chronic diarrhea, Constipation, Excessive gas, Gets full quickly at meals, Hemorrhoids, Indigestion, Nausea, Rectal Pain and Vomiting. Male Genitourinary Present- Nocturia. Not  Present- Blood in Urine, Change in Urinary Stream, Frequency, Impotence, Painful Urination, Urgency and Urine Leakage. Musculoskeletal Present- Back Pain, Joint Pain, Joint Stiffness and Muscle Pain. Not Present- Muscle Weakness and Swelling of Extremities. Neurological Present- Decreased Memory and Headaches. Not Present- Fainting, Numbness, Seizures, Tingling, Tremor, Trouble walking and Weakness. Psychiatric Present- Anxiety and Change in Sleep Pattern. Not Present- Bipolar, Depression, Fearful and Frequent crying. Endocrine Present- Excessive Hunger. Not Present- Cold Intolerance, Hair Changes, Heat Intolerance and New Diabetes.  Vitals (Ronald Summers; 04/06/2017 8:16 AM) 04/06/2017 8:15 AM Weight: 266 lb Height: 70in Body Surface Area: 2.36 m Body Mass Index: 38.17 kg/m  Temp.: 98.34F(Oral)  Pulse: 72 (Regular)  BP: 132/80 (Sitting, Left Arm, Standard)      Assessment & Plan Ronald Hiss M. Laressa Bolinger MD; 04/06/2017 8:42 AM)  OBESITY, CLASS III, BMI 40-49.9 (MORBID OBESITY) (E66.01) Impression: We reviewed his preoperative workup. We discussed the lab results. We also reviewed his cardiac consultation. All of his questions were asked and answered regarding the intraoperative and postoperative recovery. We also discussed the postoperative diet plan. We also discussed changes in eating techniques as well. He was given his preoperative instructions today along with his postoperative medications.  Current Plans Pt Education - EMW_preopbariatric GASTROESOPHAGEAL REFLUX DISEASE, ESOPHAGITIS PRESENCE NOT SPECIFIED (K21.9) Story: had egd at [place]; reports he had dilation  OBSTRUCTIVE SLEEP APNEA ON CPAP (G47.33)  ANXIETY AND DEPRESSION (F41.9)  HIATAL HERNIA  (K44.9) Impression: We discussed the findings of a hiatal hernia on upper GI. I explained that I would test for one intraoperatively. We discussed what operative repair would involve. We systemically discuss dissecting out the left and right crura of the diaphragm and reapproximating it was sutured. I believe this may be achieving to sensation of intermittent food getting stuck particularly thick solids. We did briefly discuss that he may also need dilatation as well however I believe his hiatal hernia is more of a problem.  CHRONIC PAIN IN RIGHT SHOULDER (M25.511)  CHRONIC PAIN OF LEFT KNEE (M25.562)  Leighton Ruff. Redmond Pulling, MD, FACS General, Bariatric, & Minimally Invasive Surgery [facility] [place]

## 2017-04-09 NOTE — Anesthesia Procedure Notes (Signed)
Procedure Name: Intubation Date/Time: 04/09/2017 12:22 PM Performed by: Carleene Cooper A Pre-anesthesia Checklist: Patient identified, Emergency Drugs available, Suction available and Patient being monitored Patient Re-evaluated:Patient Re-evaluated prior to induction Oxygen Delivery Method: Circle system utilized Preoxygenation: Pre-oxygenation with 100% oxygen Induction Type: IV induction Ventilation: Mask ventilation without difficulty Laryngoscope Size: Mac and 3 Grade View: Grade I Tube type: Oral Tube size: 7.5 mm Number of attempts: 1 Airway Equipment and Method: Stylet Placement Confirmation: ETT inserted through vocal cords under direct vision,  positive ETCO2 and breath sounds checked- equal and bilateral Secured at: 23 cm Tube secured with: Tape Dental Injury: Teeth and Oropharynx as per pre-operative assessment

## 2017-04-10 LAB — CBC WITH DIFFERENTIAL/PLATELET
BASOS ABS: 0 10*3/uL (ref 0.0–0.1)
BASOS PCT: 0 %
Eosinophils Absolute: 0 10*3/uL (ref 0.0–0.7)
Eosinophils Relative: 0 %
HEMATOCRIT: 45.6 % (ref 39.0–52.0)
HEMOGLOBIN: 15.8 g/dL (ref 13.0–17.0)
Lymphocytes Relative: 10 %
Lymphs Abs: 1.2 10*3/uL (ref 0.7–4.0)
MCH: 29.9 pg (ref 26.0–34.0)
MCHC: 34.6 g/dL (ref 30.0–36.0)
MCV: 86.4 fL (ref 78.0–100.0)
Monocytes Absolute: 0.8 10*3/uL (ref 0.1–1.0)
Monocytes Relative: 7 %
Neutro Abs: 9.9 10*3/uL — ABNORMAL HIGH (ref 1.7–7.7)
Neutrophils Relative %: 83 %
Platelets: 223 10*3/uL (ref 150–400)
RBC: 5.28 MIL/uL (ref 4.22–5.81)
RDW: 12.6 % (ref 11.5–15.5)
WBC: 12 10*3/uL — AB (ref 4.0–10.5)

## 2017-04-10 LAB — COMPREHENSIVE METABOLIC PANEL
ALBUMIN: 3.9 g/dL (ref 3.5–5.0)
ALK PHOS: 63 U/L (ref 38–126)
ALT: 53 U/L (ref 17–63)
AST: 41 U/L (ref 15–41)
Anion gap: 10 (ref 5–15)
BILIRUBIN TOTAL: 0.7 mg/dL (ref 0.3–1.2)
BUN: 8 mg/dL (ref 6–20)
CO2: 25 mmol/L (ref 22–32)
Calcium: 9 mg/dL (ref 8.9–10.3)
Chloride: 101 mmol/L (ref 101–111)
Creatinine, Ser: 1.17 mg/dL (ref 0.61–1.24)
GFR calc Af Amer: >60 mL/min (ref >60)
GFR calc non Af Amer: >60 mL/min (ref >60)
GLUCOSE: 124 mg/dL — AB (ref 65–99)
POTASSIUM: 4 mmol/L (ref 3.5–5.1)
Sodium: 136 mmol/L (ref 135–145)
TOTAL PROTEIN: 7.3 g/dL (ref 6.5–8.1)

## 2017-04-10 MED ORDER — OXYCODONE HCL 5 MG/5ML PO SOLN: 5.0000 mg | ORAL | 0 refills | Status: AC | PRN

## 2017-04-10 NOTE — Progress Notes (Signed)
Assessment unchanged. Pt verbalized understanding of dc instructions through teach back regarding follow up care and when to call the doctor. No scripts. Discharged via foot per pt request to front entrance accompanied by NT and significant other.

## 2017-04-10 NOTE — Anesthesia Postprocedure Evaluation (Signed)
Anesthesia Post Note  Patient: ALEIX KARMEL  Procedure(s) Performed: Procedure(s) (LRB): LAPAROSCOPIC GASTRIC SLEEVE RESECTION (N/A) UPPER GI ENDOSCOPY     Patient location during evaluation: PACU Anesthesia Type: General Level of consciousness: awake and alert Pain management: pain level controlled Vital Signs Assessment: post-procedure vital signs reviewed and stable Respiratory status: spontaneous breathing, nonlabored ventilation, respiratory function stable and patient connected to nasal cannula oxygen Cardiovascular status: blood pressure returned to baseline and stable Postop Assessment: no signs of nausea or vomiting Anesthetic complications: no    Last Vitals:  Vitals:   04/10/17 0529 04/10/17 0959  BP: (!) 142/90 139/84  Pulse: (!) 57 72  Resp: 16 17  Temp: 37.1 C 37.2 C  SpO2: 97% 97%    Last Pain:  Vitals:   04/10/17 0959  TempSrc: Oral  PainSc:                  Girolamo Lortie,JAMES TERRILL

## 2017-04-10 NOTE — Discharge Summary (Signed)
Physician Discharge Summary  Ronald Summers VVO:[id number] DOB: [birth year] DOA: 04/09/2017  PCP: Patient, No Pcp Per  Admit date: 04/09/2017 Discharge date: 04/10/2017  Recommendations for Outpatient Follow-up:   Follow-up Information    Greer Pickerel, MD. Go on 04/26/2017.   Specialty:  General Surgery Why:  at [place] information: [address] [place] [address] [phone]        Greer Pickerel, MD Follow up.   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 Please see operative note. Preoperatively the patient was given 5000 units of subcutaneous heparin for DVT prophylaxis. Postoperative prophylactic Lovenox dosing was started on the evening of postoperative day 0. On the evening of postoperative day 0, the patient was started on water and ice chips. On postoperative day 1 the patient had no fever or tachycardia and was tolerating water in their diet was gradually advanced throughout the day. The patient was ambulating without difficulty. Their vital signs are stable without fever or tachycardia. Their hemoglobin had remained stable. The patient was maintained on their home settings for CPAP therapy. The patient had received discharge instructions and counseling. They were deemed stable for discharge and had met discharge criteria  BP (!) 142/90  (BP Location: Left Arm)   Pulse (!) 57   Temp 98.7 F (37.1 C) (Oral)   Resp 16   Ht '5\' 9"'$  (1.753 m)   Wt 121.1 kg (267 lb)   SpO2 97%   BMI 39.43 kg/m   Gen: alert, NAD, non-toxic appearing Pupils: equal, no scleral icterus Pulm: Lungs clear to auscultation, symmetric chest rise CV: regular rate and rhythm Abd: soft, min tender, nondistended. No cellulitis. No incisional hernia Ext: no edema, no calf tenderness Skin: no rash, no jaundice   Discharge Instructions  Discharge Instructions    Ambulate hourly while awake    Complete by:  As directed    Call MD for:  difficulty breathing, headache or visual disturbances    Complete by:  As directed    Call MD for:  persistant dizziness or light-headedness    Complete by:  As directed    Call MD for:  persistant nausea and vomiting    Complete by:  As directed    Call MD for:  redness, tenderness, or signs of infection (pain, swelling, redness, odor or green/yellow discharge around incision site)    Complete by:  As directed    Call MD for:  severe uncontrolled pain    Complete by:  As directed    Call MD for:  temperature >101 F    Complete by:  As directed    Diet bariatric full liquid    Complete by:  As directed    Discharge instructions    Complete by:  As directed    See bariatric discharge instructions   Incentive spirometry  Complete by:  As directed    Perform hourly while awake     Allergies as of 04/10/2017   No Known Allergies     Medication List    TAKE these medications   ADULT GUMMY Chew Chew 1 tablet by mouth 3 (three) times daily.   calcium carbonate 500 MG chewable tablet Commonly known as:  TUMS - dosed in mg elemental calcium Chew 1 tablet by mouth 2 (two) times daily.   clonazePAM 2 MG tablet Commonly known as:  KLONOPIN Take 2 mg by mouth at bedtime.   FLUoxetine 20 MG tablet Commonly known as:  PROZAC Take 20 mg by mouth daily.   oxyCODONE 5 MG/5ML solution Commonly known as:   ROXICODONE Take 5-10 mLs (5-10 mg total) by mouth every 4 (four) hours as needed for moderate pain or severe pain.            Discharge Care Instructions        Start     Ordered   04/10/17 0000  oxyCODONE (ROXICODONE) 5 MG/5ML solution  Every 4 hours PRN     04/10/17 0741   04/10/17 0000  Discharge instructions    Comments:  See bariatric discharge instructions   04/10/17 0741   04/10/17 0000  Diet bariatric full liquid     04/10/17 0741   04/10/17 0000  Ambulate hourly while awake     04/10/17 0741   04/10/17 0000  Incentive spirometry    Comments:  Perform hourly while awake   04/10/17 0741   04/10/17 0000  Call MD for:  temperature >101 F     04/10/17 0741   04/10/17 0000  Call MD for:  persistant nausea and vomiting     04/10/17 0741   04/10/17 0000  Call MD for:  severe uncontrolled pain     04/10/17 0741   04/10/17 0000  Call MD for:  redness, tenderness, or signs of infection (pain, swelling, redness, odor or green/yellow discharge around incision site)     04/10/17 0741   04/10/17 0000  Call MD for:  difficulty breathing, headache or visual disturbances     04/10/17 0741   04/10/17 0000  Call MD for:  persistant dizziness or light-headedness     04/10/17 0741     Follow-up Information    Greer Pickerel, MD. Go on 04/26/2017.   Specialty:  General Surgery Why:  at [place] information: [address] [place] [address] [phone]        Greer Pickerel, MD Follow up.   Specialty:  General Surgery Contact information: [place] [address] [phone]            The results of significant diagnostics from this hospitalization (including imaging, microbiology, ancillary and laboratory) are listed below for reference.    Significant Diagnostic Studies: No results found.  Labs: Basic Metabolic Panel:  Recent Labs Lab 04/06/17 1120 04/10/17 0449  NA 138 136  K 4.2 4.0  CL 103 101  CO2 27 25   GLUCOSE 101* 124*  BUN 9 8  CREATININE 1.06 1.17  CALCIUM 9.2 9.0   Liver Function Tests:  Recent Labs Lab 04/06/17 1120 04/10/17 0449  AST 27 41  ALT 31 53  ALKPHOS 72 63  BILITOT 1.0 0.7  PROT 7.9 7.3  ALBUMIN 4.3 3.9    CBC:  Recent Labs Lab 04/06/17 1120 04/09/17 1610 04/10/17 0449  WBC 7.9  --  12.0*  NEUTROABS 5.2  --  9.9*  HGB 16.4 16.2 15.8  HCT 46.2 46.7 45.6  MCV 85.2  --  86.4  PLT 235  --  223    CBG: No results for input(s): GLUCAP in the last 168 hours.  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

## 2017-04-10 NOTE — Progress Notes (Signed)
Patient alert and oriented, pain is controlled. Patient is tolerating fluids, advanced to protein shake today, patient is tolerating well.  Reviewed Gastric sleeve discharge instructions with patient and patient is able to articulate understanding.  Provided information on BELT program, Support Group and [facility] outpatient pharmacy. All questions answered, will continue to monitor.  

## 2017-04-10 NOTE — Plan of Care (Signed)
Problem: Food- and Nutrition-Related Knowledge Deficit (NB-1.1) Goal: Nutrition education Formal process to instruct or train a patient/client in a skill or to impart knowledge to help patients/clients voluntarily manage or modify food choices and eating behavior to maintain or improve health. Outcome: Completed/Met Date Met: 04/10/17 Nutrition Education Note  Received consult for diet education per DROP protocol.   Discussed 2 week post op diet with pt. Emphasized that liquids must be non carbonated, non caffeinated, and sugar free. Fluid goals discussed. Pt to follow up with outpatient bariatric RD for further diet progression after 2 weeks. Multivitamins and minerals also reviewed. Teach back method used, pt expressed understanding, expect good compliance.   Diet: First 2 Weeks  You will see the nutritionist about two (2) weeks after your surgery. The nutritionist will increase the types of foods you can eat if you are handling liquids well:  If you have severe vomiting or nausea and cannot handle clear liquids lasting longer than 1 day, call your surgeon  Protein Shake  Drink at least 2 ounces of shake 5-6 times per day  Each serving of protein shakes (usually 8 - 12 ounces) should have a minimum of:  15 grams of protein  And no more than 5 grams of carbohydrate  Goal for protein each day:  Men = 80 grams per day  Women = 60 grams per day  Protein powder may be added to fluids such as non-fat milk or Lactaid milk or Soy milk (limit to 35 grams added protein powder per serving)   Hydration  Slowly increase the amount of water and other clear liquids as tolerated (See Acceptable Fluids)  Slowly increase the amount of protein shake as tolerated  Sip fluids slowly and throughout the day  May use sugar substitutes in small amounts (no more than 6 - 8 packets per day; i.e. Splenda)   Fluid Goal  The first goal is to drink at least 8 ounces of protein shake/drink per day (or as directed  by the nutritionist); some examples of protein shakes are Premier Protein, Syntrax Nectar, Adkins Advantage, EAS Edge HP, and Unjury. See handout from pre-op Bariatric Education Class:  Slowly increase the amount of protein shake you drink as tolerated  You may find it easier to slowly sip shakes throughout the day  It is important to get your proteins in first  Your fluid goal is to drink 64 - 100 ounces of fluid daily  It may take a few weeks to build up to this  32 oz (or more) should be clear liquids  And  32 oz (or more) should be full liquids (see below for examples)  Liquids should not contain sugar, caffeine, or carbonation   Clear Liquids:  Water or Sugar-free flavored water (i.e. Fruit H2O, Propel)  Decaffeinated coffee or tea (sugar-free)  Crystal Lite, Wyler's Lite, Minute Maid Lite  Sugar-free Jell-O  Bouillon or broth  Sugar-free Popsicle: *Less than 20 calories each; Limit 1 per day   Full Liquids:  Protein Shakes/Drinks + 2 choices per day of other full liquids  Full liquids must be:  No More Than 12 grams of Carbs per serving  No More Than 3 grams of Fat per serving  Strained low-fat cream soup  Non-Fat milk  Fat-free Lactaid Milk  Sugar-free yogurt (Dannon Lite & Fit, Greek yogurt, Oikos Zero)   Lindsey Baker, MS, RD, LDN Pager: [phone] After Hours Pager: [phone]     

## 2017-04-10 NOTE — Progress Notes (Signed)
Patient alert and oriented, Post op day 1.  Provided support and encouragement.  Encouraged pulmonary toilet, ambulation and small sips of liquids.  Completed clear liquid fluids 2 ounces of protein shake completed.  All questions answered.  Will continue to monitor.

## 2017-04-19 ENCOUNTER — Telehealth (HOSPITAL_COMMUNITY): Payer: Self-pay

## 2017-04-19 NOTE — Telephone Encounter (Signed)
Left voice message for patient with contact information to return call to discuss the post discharge follow up  questions below.  Made discharge phone call to patient. Asking the following questions.    1. Do you have someone to care for you now that you are home?   2. Are you having pain now that is not relieved by your pain medication?   3. Are you able to drink the recommended daily amount of fluids (48 ounces minimum/day) and protein (60-80 grams/day) as prescribed by the dietitian or nutritional counselor?   4. Are you taking the vitamins and minerals as prescribed?   5. Do you have the "on call" number to contact your surgeon if you have a problem or question?   6. Are your incisions free of redness, swelling or drainage? (If steri strips, address that these can fall off, shower as tolerated)  7. Have your bowels moved since your surgery?  If not, are you passing gas?   8. Are you up and walking 3-4 times per day?   9. Were you provided your discharge medications before your surgery or before you were discharged from the hospital and are you taking them without problem?

## 2017-04-24 ENCOUNTER — Encounter: Payer: Self-pay | Admitting: Skilled Nursing Facility1

## 2017-04-24 ENCOUNTER — Encounter: Payer: Federal, State, Local not specified - PPO | Attending: General Surgery | Admitting: Skilled Nursing Facility1

## 2017-04-24 DIAGNOSIS — Z6841 Body Mass Index (BMI) 40.0 and over, adult: Secondary | ICD-10-CM | POA: Insufficient documentation

## 2017-04-24 DIAGNOSIS — Z713 Dietary counseling and surveillance: Secondary | ICD-10-CM | POA: Diagnosis present

## 2017-04-24 NOTE — Progress Notes (Signed)
Bariatric Class:  Appt start time: 1530 end time:  1630.  2 Week Post-Operative Nutrition Class  Patient was seen on 04/24/2017 for Post[facility]ative [facility]   Pt states he is tired all the time. Pt states he sips on 50 grams of protein the first half of the day. Pt states he is hitting 80 grams of protein in the day. Pt states he has been having a few headaches lasting about 30 minutes, hx TBI. Pt states he walks with his daughter every day. Pt states he enjoys his carbmaster milk. Pt states he is pretty sure he is getting well over 64 fluid ounces a day. Pt has been having his multivitamin and calcium every day. Pt states he hopes to get into his intense workouts (lifting 450lbs) very soon.  Dietitian advised he discuss his headaches, nodules at incision, tiredness, and incision discomfort with his surgeon.   Surgery date: 04/09/2017 Surgery type: Sleeve Start weight at [facility]: 284.9 Weight today: 247.6 Weight change: 29.4  TANITA  BODY COMP RESULTS  04/24/2017   BMI (kg/m^2) 36.6   Fat Mass (lbs) 85.2   Fat Free Mass (lbs) 162.4   Total Body Water (lbs) 118.8   The following the learning objectives were met by the patient during this course:  Identifies Phase 3A (Soft, High Proteins) Dietary Goals and will begin from 2 weeks post-operatively to 2 months post-operatively  Identifies appropriate sources of fluids and proteins   States protein recommendations and appropriate sources post-operatively  Identifies the need for appropriate texture modifications, mastication, and bite sizes when consuming solids  Identifies appropriate multivitamin and calcium sources post-operatively  Describes the need for physical activity post-operatively and will follow MD recommendations  States when to call healthcare provider regarding medication questions or post-operative complications  Handouts given during class include:  Phase 3A:  Soft, High Protein Diet Handout  Follow-Up Plan: Patient will follow-up at [facility] in 6 weeks for 2 month post-op nutrition visit for diet advancement per MD.

## 2017-05-23 ENCOUNTER — Encounter: Payer: Self-pay | Admitting: Skilled Nursing Facility1

## 2017-05-23 ENCOUNTER — Encounter: Payer: Federal, State, Local not specified - PPO | Attending: General Surgery | Admitting: Skilled Nursing Facility1

## 2017-05-23 DIAGNOSIS — Z713 Dietary counseling and surveillance: Secondary | ICD-10-CM | POA: Insufficient documentation

## 2017-05-23 DIAGNOSIS — Z6841 Body Mass Index (BMI) 40.0 and over, adult: Secondary | ICD-10-CM | POA: Diagnosis not present

## 2017-05-23 NOTE — Patient Instructions (Signed)
-  Try starchy vegetables before you try fruit  -Start with soft cooked vegetables for the next day or two then if that goes well try the raw vegetables  -Try one small piece of fruit or 1/2 cup of fruit for a snack after you know you had no issue with the vegetable   -Take 30 minutes to eat

## 2017-05-23 NOTE — Progress Notes (Signed)
Sleeve Gastrectomy  Patient was seen on 04/24/2017 for Post-Operative Nutrition education at the [facility].   Pt states since eating more he has had less headaches. Pt states chicken feels heavy with some cramping. Pt states he had diarrhea from decaff coffee. Pt states he eats until satisfaction. Pt states he has a bowel movement 3-4 times a week. Pt states he is still getting over pneumonia having been out of work for 1 week. Pt states his headaches have gotten much better since eating more food.   Surgery date: 04/09/2017 Surgery type: Sleeve Start weight at [facility]: 284.9 Weight today: 224.4 Weight change: 23.2  TANITA  BODY COMP RESULTS  04/24/2017 05/23/2017   BMI (kg/m^2) 36.6 33.1   Fat Mass (lbs) 85.2 67.4   Fat Free Mass (lbs) 162.4 157   Total Body Water (lbs) 118.8 112   24-hr recall: B (AM): protein shake Snk (9:30AM): cheese stick L (11:30-12PM): tuna Snk (2:30PM): cheesestick D (PM): shrimp or BBQ Snk (PM):   Fluid intake: water, crystal light, decaff coffee:90 fluid ounces  Estimated total protein intake: 80+  Medications: See List Supplementation: Taking  Using straws: no Drinking while eating: no Having you been chewing well:no Chewing/swallowing difficulties: no Changes in vision: no Changes to mood/headaches: no Hair loss/Cahnges to skin/Changes to nails: no Any difficulty focusing or concentrating: no Sweating: no Dizziness/Lightheaded:  Palpitations: no  Carbonated beverages: no N/V/D/C/GAS: no Abdominal Pain: no Dumping syndrome: no  Recent physical activity:    Progress Towards Goal(s):  In progress.  Handouts given during visit include:  Non-starchy vegetables + protein   Nutritional Diagnosis:  [place]-3.3 Overweight/obesity related to past poor dietary habits and physical inactivity as evidenced by patient w/ recent Sleeve Gastrectomy surgery following dietary guidelines for continued weight loss.     Intervention:  Nutrition counseling. Dietitian educated the pt on advancing his diet to include vegetables and fruit. Goals: -Try starchy vegetables before you try fruit -Start with soft cooked vegetables for the next day or two then if that goes well try the raw vegetables -Try one small piece of fruit or 1/2 cup of fruit for a snack after you know you had no issue with the vegetable  -Take 30 minutes to eat  Teaching Method Utilized:  Visual Auditory Hands on  Barriers to learning/adherence to lifestyle change: none identified   Demonstrated degree of understanding via:  Teach Back   Monitoring/Evaluation:  Dietary intake, exercise, and body weight.

## 2017-06-06 ENCOUNTER — Ambulatory Visit: Payer: Federal, State, Local not specified - PPO | Admitting: Skilled Nursing Facility1

## 2017-07-25 ENCOUNTER — Encounter: Payer: Federal, State, Local not specified - PPO | Attending: General Surgery | Admitting: Skilled Nursing Facility1

## 2017-07-25 ENCOUNTER — Encounter: Payer: Self-pay | Admitting: Skilled Nursing Facility1

## 2017-07-25 DIAGNOSIS — Z6841 Body Mass Index (BMI) 40.0 and over, adult: Secondary | ICD-10-CM | POA: Insufficient documentation

## 2017-07-25 DIAGNOSIS — Z713 Dietary counseling and surveillance: Secondary | ICD-10-CM | POA: Diagnosis not present

## 2017-07-25 NOTE — Progress Notes (Signed)
Sleeve Gastrectomy  Patient was seen on 04/24/2017 for Post-Operative Nutrition education at the [facility].   Pt states he is 189 pounds. Pt states he wants to be 185 pounds. Pt states he tried ice cream with no issue. Pt states spinach is causing diarrhea. 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     Surgery date: 04/09/2017 Surgery type: Sleeve [facility]: 284.9 Weight today: 189 as reported   TANITA  BODY COMP RESULTS  04/24/2017 05/23/2017   BMI (kg/m^2) 36.6 33.1   Fat Mass (lbs) 85.2 67.4   Fat Free Mass (lbs) 162.4 157   Total Body Water (lbs) 118.8 112   24-hr recall: sometimes a cheesestick or slim jim for snack B (AM): protein shake (because no time) Snk (9:30AM): 2 eggs    L (11:30-12PM): salad and chicken  Snk (2:30PM):  D (PM): potato and steak  Snk (PM):   Fluid intake: water, crystal light, decaff coffee with hazel nut creamer: 90 fluid ounces  Estimated total protein intake: 80+  Medications: See List Supplementation: Taking  Using straws: no Drinking while eating: no Having you been chewing well:no Chewing/swallowing difficulties: no Changes in vision: no Changes to mood/headaches: no Hair loss/Cahnges to skin/Changes to nails: no Any difficulty focusing or concentrating: no Sweating: no Dizziness/Lightheaded:  Palpitations: no  Carbonated beverages: no N/V/D/C/GAS: no Abdominal Pain: no Dumping syndrome: no  Recent physical activity:  ADLs  Progress Towards Goal(s):  In progress.  Handouts given during visit include:  Non-starchy vegetables + protein   Nutritional Diagnosis:  [place]-3.3 Overweight/obesity related to past poor dietary habits and physical inactivity as evidenced by patient w/ recent Sleeve Gastrectomy surgery following dietary guidelines for continued  weight loss.    Intervention:  Nutrition counseling. Dietitian educated the pt on advancing his diet to include vegetables and fruit. Goals: -Have a piece of fruit or 1/2 cup of berries with your egg -Try to have 2-3 snacks  -There are no foods that are off limits   Teaching Method Utilized:  Visual Auditory Hands on  Barriers to learning/adherence to lifestyle change: none identified   Demonstrated degree of understanding via:  Teach Back   Monitoring/Evaluation:  Dietary intake, exercise, and body weight.

## 2017-07-25 NOTE — Patient Instructions (Addendum)
-  Have a piece of fruit or 1/2 cup of berries with your egg  -Try to have 2-3 snacks   -There are no foods that are off limits

## 2018-03-27 ENCOUNTER — Ambulatory Visit: Payer: Federal, State, Local not specified - PPO | Admitting: Skilled Nursing Facility1

## 2018-04-04 IMAGING — DX DG CHEST 2V
2 series · 2 of 2 positions shown · non-contrast
Comparison: None.

CLINICAL DATA: Preoperative respiratory evaluation.

EXAM:
CHEST  2 VIEW

[chest pa]
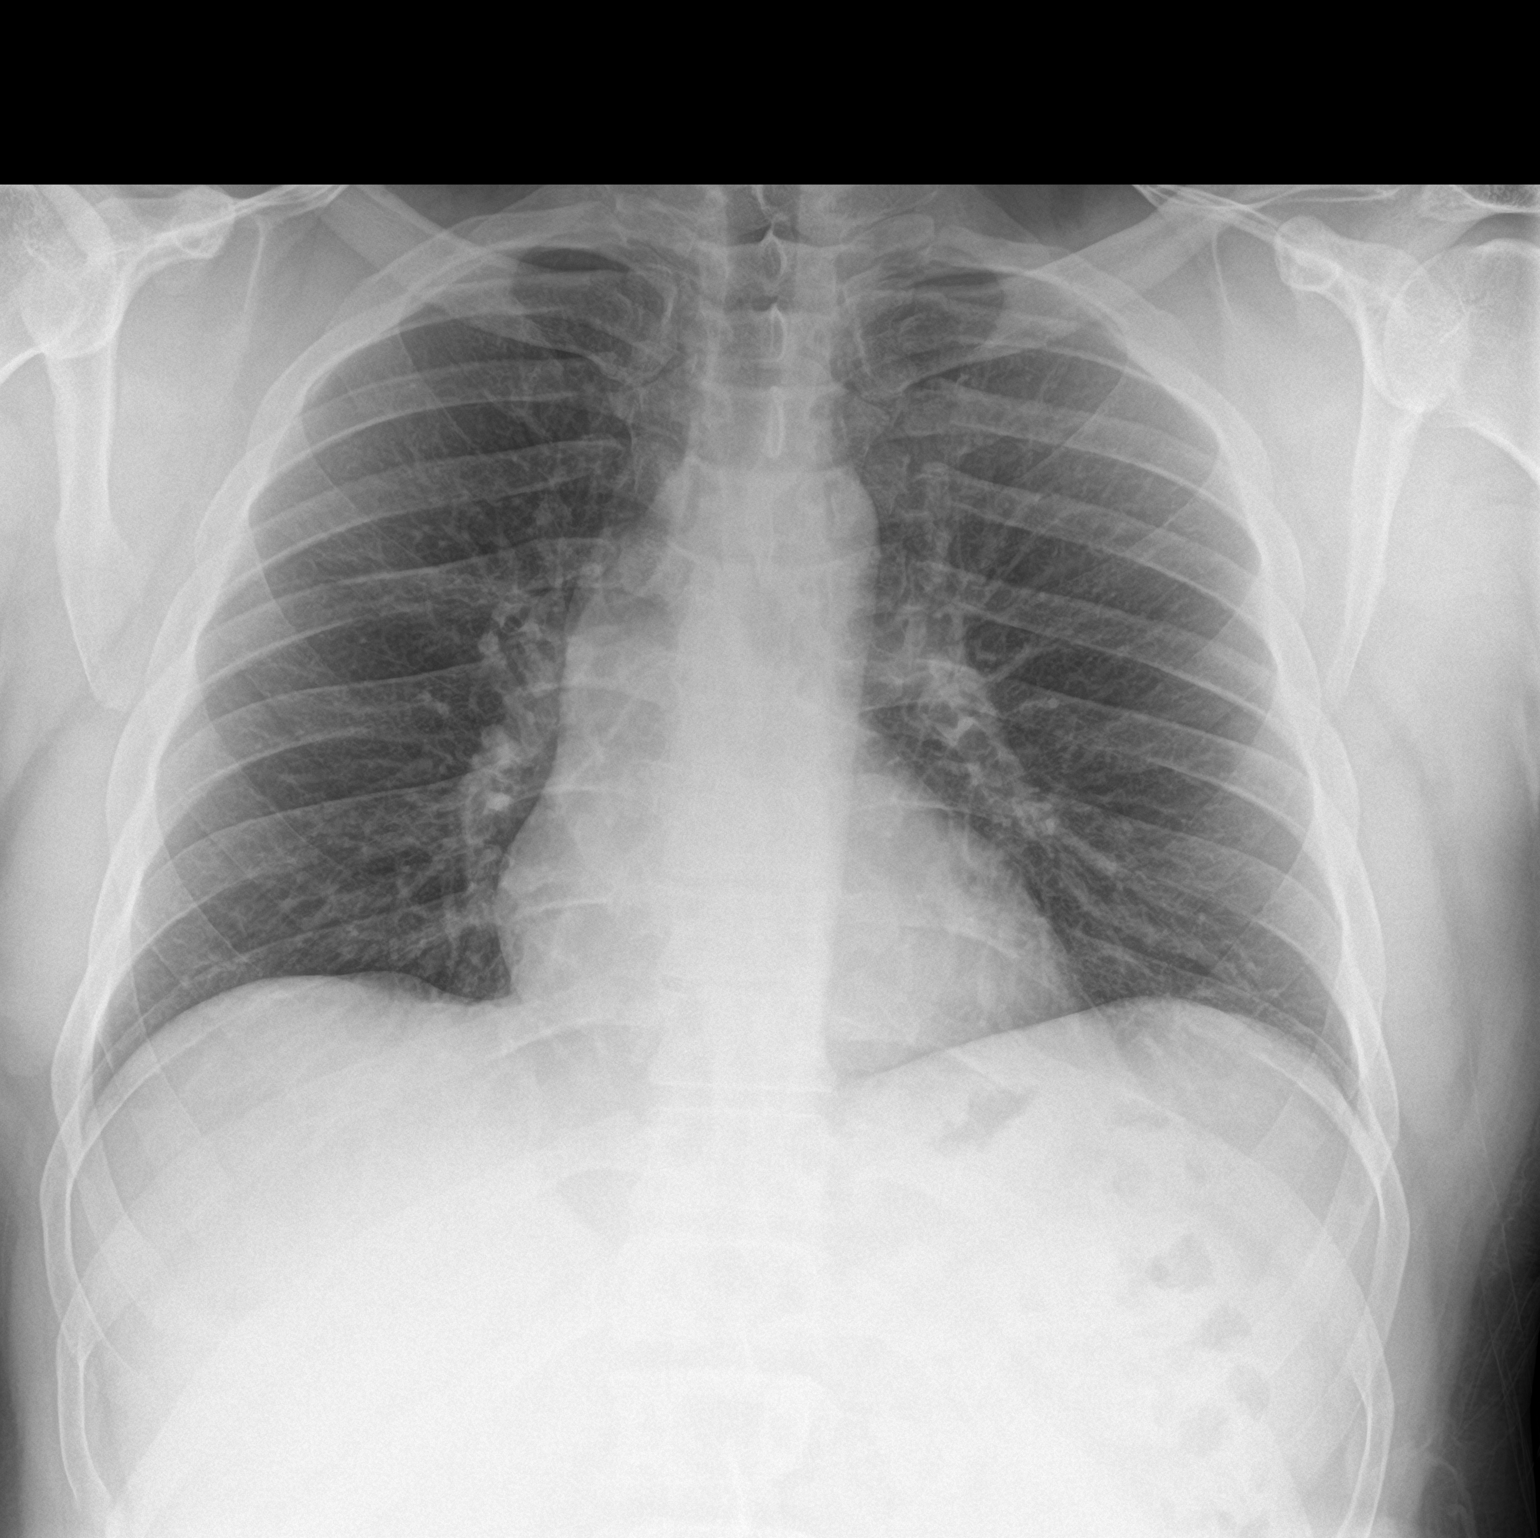

[chest lat]
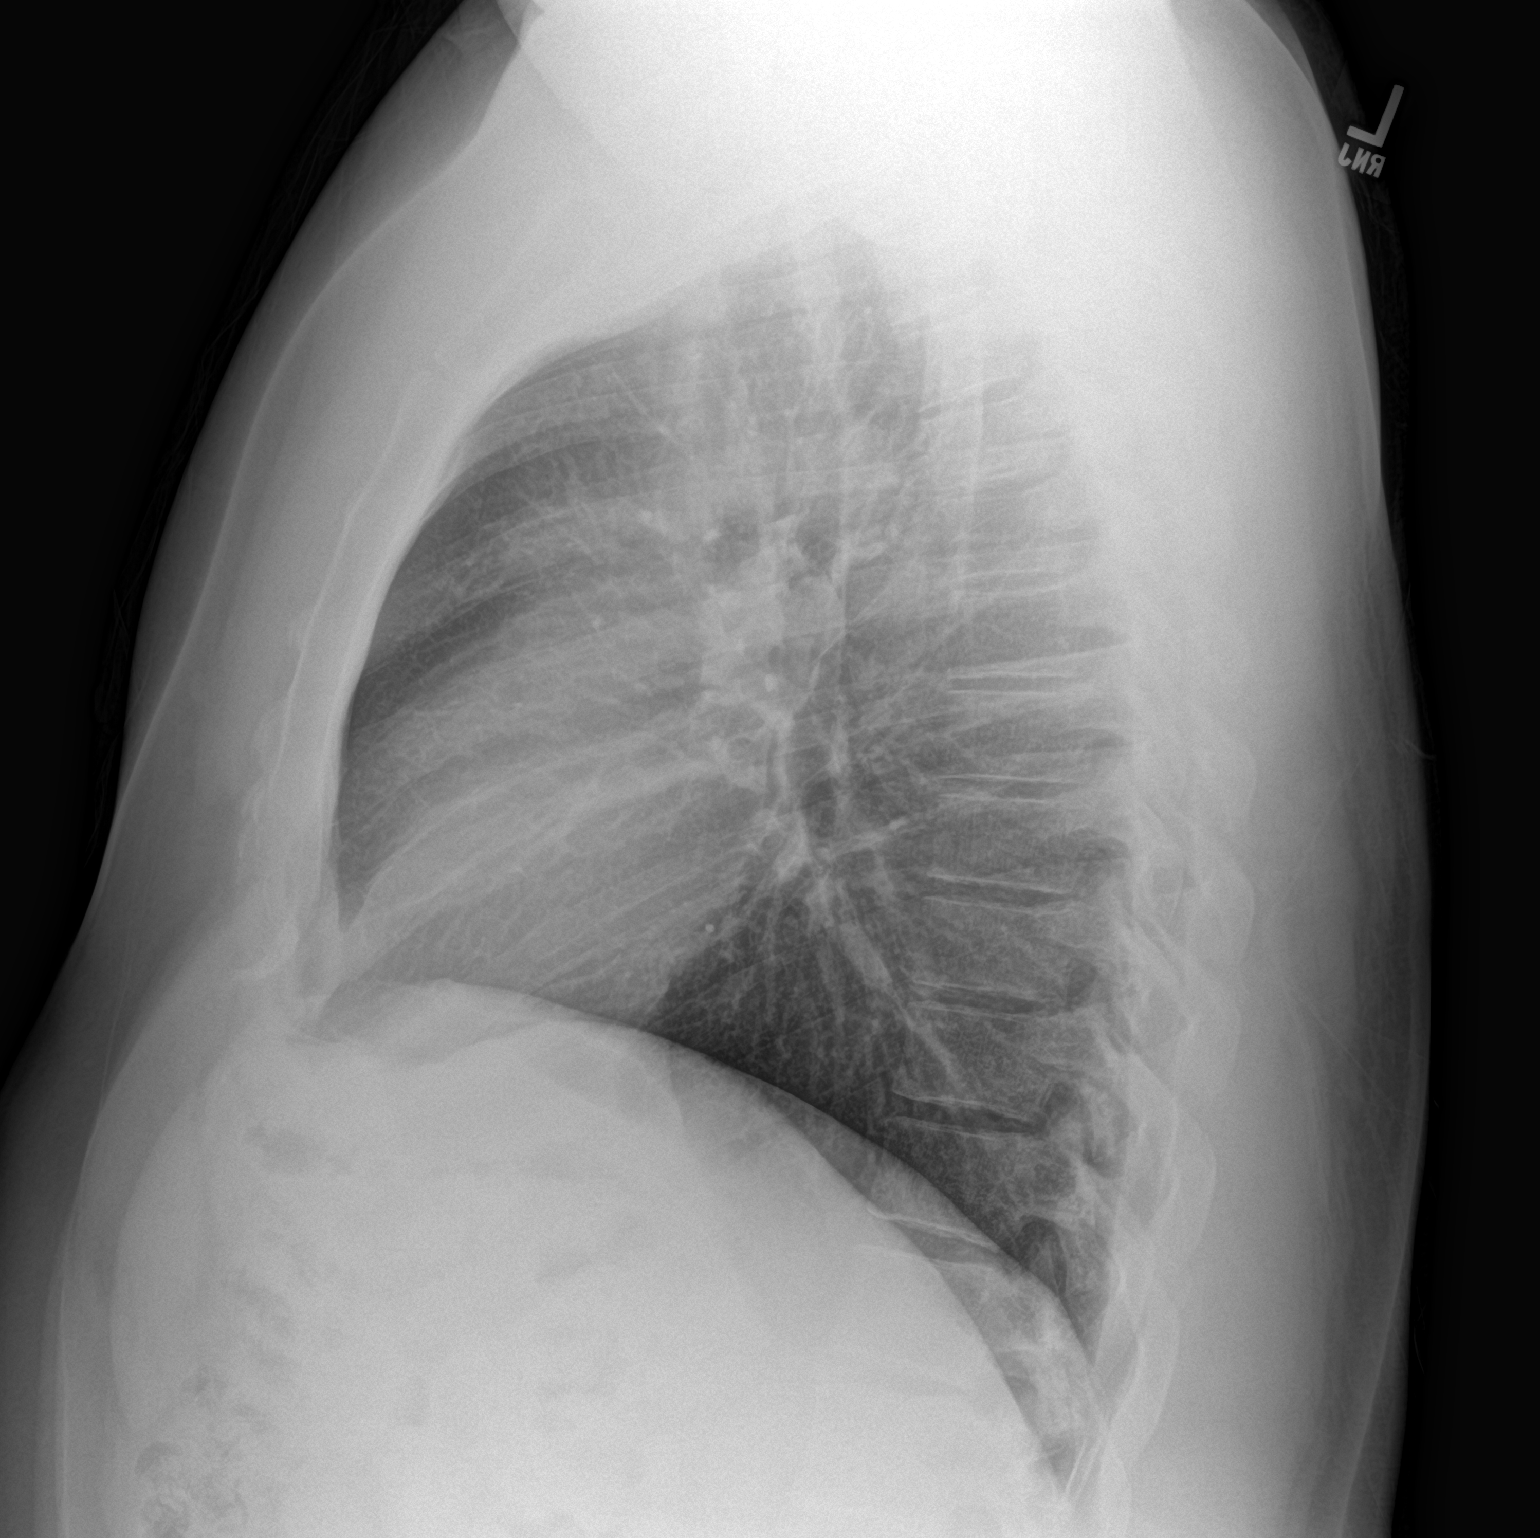

[2 of 2 positions shown; findings below may reference images not displayed]

FINDINGS: The heart size and mediastinal contours are within normal limits.
Both lungs are clear. The visualized skeletal structures are
unremarkable.
IMPRESSION: No active cardiopulmonary disease.

## 2018-10-28 ENCOUNTER — Encounter (HOSPITAL_COMMUNITY): Payer: Self-pay

## 2019-10-14 ENCOUNTER — Encounter (HOSPITAL_COMMUNITY): Payer: Self-pay

## 2020-10-28 ENCOUNTER — Encounter (HOSPITAL_COMMUNITY): Payer: Self-pay | Admitting: *Deleted

## 2021-11-04 ENCOUNTER — Encounter (HOSPITAL_COMMUNITY): Payer: Self-pay | Admitting: *Deleted

## 2023-07-04 ENCOUNTER — Other Ambulatory Visit
Admission: RE | Admit: 2023-07-04 | Discharge: 2023-07-04 | Disposition: A | Discharge status: Home / Self Care | Payer: Federal, State, Local not specified - PPO | Source: Ambulatory Visit | Attending: Family Medicine | Admitting: Family Medicine

## 2023-07-04 DIAGNOSIS — R1011 Right upper quadrant pain: Secondary | ICD-10-CM | POA: Insufficient documentation

## 2023-07-04 DIAGNOSIS — M546 Pain in thoracic spine: Secondary | ICD-10-CM | POA: Insufficient documentation

## 2023-07-04 LAB — D-DIMER, QUANTITATIVE: D-Dimer, Quant: <0.27 ug{FEU}/mL (ref 0.00–0.50)
